# Patient Record
Sex: Male | Born: 1995 | Race: Black or African American | Hispanic: No | Marital: Single | Smoking: Never smoker
Health system: Southern US, Community
[De-identification: ages and names within clinical notes are randomized; demographics above are authoritative.]

## PROBLEM LIST (undated history)

## (undated) ENCOUNTER — Ambulatory Visit (HOSPITAL_COMMUNITY): Disposition: A | Discharge status: Other Acute Inpatient Hospital | Payer: BLUE CROSS/BLUE SHIELD

## (undated) DIAGNOSIS — F419 Anxiety disorder, unspecified: Secondary | ICD-10-CM

## (undated) DIAGNOSIS — G43909 Migraine, unspecified, not intractable, without status migrainosus: Secondary | ICD-10-CM

## (undated) HISTORY — PX: WISDOM TOOTH EXTRACTION: SHX21

---

## 2013-09-19 ENCOUNTER — Emergency Department (HOSPITAL_COMMUNITY)
Admission: EM | Admit: 2013-09-19 | Discharge: 2013-09-19 | Disposition: A | Discharge status: Home / Self Care | Payer: BC Managed Care – PPO | Attending: Emergency Medicine | Admitting: Emergency Medicine

## 2013-09-19 ENCOUNTER — Encounter (HOSPITAL_COMMUNITY): Payer: Self-pay | Admitting: Emergency Medicine

## 2013-09-19 ENCOUNTER — Emergency Department (HOSPITAL_COMMUNITY): Payer: BC Managed Care – PPO

## 2013-09-19 DIAGNOSIS — F411 Generalized anxiety disorder: Secondary | ICD-10-CM | POA: Diagnosis not present

## 2013-09-19 DIAGNOSIS — R002 Palpitations: Secondary | ICD-10-CM | POA: Diagnosis not present

## 2013-09-19 DIAGNOSIS — R0789 Other chest pain: Secondary | ICD-10-CM | POA: Diagnosis present

## 2013-09-19 DIAGNOSIS — R079 Chest pain, unspecified: Secondary | ICD-10-CM

## 2013-09-19 HISTORY — DX: Anxiety disorder, unspecified: F41.9

## 2013-09-19 LAB — CBC
HCT: 43.2 % (ref 39.0–52.0)
Hemoglobin: 15.2 g/dL (ref 13.0–17.0)
MCH: 30.6 pg (ref 26.0–34.0)
MCHC: 35.2 g/dL (ref 30.0–36.0)
MCV: 87.1 fL (ref 78.0–100.0)
PLATELETS: 240 10*3/uL (ref 150–400)
RBC: 4.96 MIL/uL (ref 4.22–5.81)
RDW: 11.9 % (ref 11.5–15.5)
WBC: 6.8 10*3/uL (ref 4.0–10.5)

## 2013-09-19 LAB — I-STAT TROPONIN, ED: Troponin i, poc: 0 ng/mL (ref 0.00–0.08)

## 2013-09-19 LAB — BASIC METABOLIC PANEL
BUN: 11 mg/dL (ref 6–23)
CO2: 27 meq/L (ref 19–32)
Calcium: 10 mg/dL (ref 8.4–10.5)
Chloride: 101 mEq/L (ref 96–112)
Creatinine, Ser: 1.16 mg/dL (ref 0.50–1.35)
GFR calc Af Amer: >90 mL/min (ref >90)
GLUCOSE: 86 mg/dL (ref 70–99)
POTASSIUM: 3.8 meq/L (ref 3.7–5.3)
Sodium: 141 mEq/L (ref 137–147)

## 2013-09-19 NOTE — ED Notes (Signed)
Hes had sharp CP and racing heart intermittent since last year when he was diagnosed with anxiety. States the pain "just comes and goes randomly." He is A&Ox4, resp e/u

## 2013-09-19 NOTE — ED Provider Notes (Signed)
CSN: [id number]     Arrival date & time 09/19/13  1725 History   First MD Initiated Contact with Patient 09/19/13 2028     Chief Complaint  Patient presents with  . Chest Pain    (Consider location/radiation/quality/duration/timing/severity/associated sxs/prior Treatment) HPI Comments: Patient is a 18 year old male with a history of anxiety who presents to the emergency department for chest pain and palpitations. Patient states he has been having these symptoms for multiple months. He states that symptoms "just come on randomly" but seem to occur almost every day. Patient states that symptoms are alleviated with sleeping or spontaneously, with time. Patient denies any aggravating factors of his symptoms. He states he was evaluated for symptoms in the past and told that they were due to anxiety. He denies associated fever, syncope, jaw pain, shortness of breath, hemoptysis or cough, numbness/tingling, extremity weakness, and vomiting. Patient denies history of heart disease or family history of ACS. He states he is otherwise healthy with no medical problems.  Patient is a 18 y.o. male presenting with chest pain. The history is provided by the patient. No language interpreter was used.  Chest Pain Associated symptoms: palpitations   Associated symptoms: no cough, no fever, no numbness, no shortness of breath, not vomiting and no weakness     Past Medical History  Diagnosis Date  . Anxiety    History reviewed. No pertinent past surgical history. History reviewed. No pertinent family history. History  Substance Use Topics  . Smoking status: Never Smoker   . Smokeless tobacco: Not on file  . Alcohol Use: No    Review of Systems  Constitutional: Negative for fever.  Respiratory: Negative for cough and shortness of breath.   Cardiovascular: Positive for chest pain and palpitations.  Gastrointestinal: Negative for vomiting.  Neurological: Negative for syncope, weakness and numbness.  All  other systems reviewed and are negative.    Allergies  Shrimp  Home Medications   Prior to Admission medications   Medication Sig Start Date End Date Taking? Authorizing Provider  ibuprofen (ADVIL,MOTRIN) 600 MG tablet Take 600 mg by mouth every 6 (six) hours as needed for mild pain.   Yes Historical Provider, MD   BP 151/66  Pulse 86  Temp(Src) 98 F (36.7 C)  Resp 14  Ht 5\' 6"  (1.676 m)  Wt 200 lb (90.719 kg)  BMI 32.30 kg/m2  SpO2 100%  Physical Exam  Nursing note and vitals reviewed. Constitutional: He is oriented to person, place, and time. He appears well-developed and well-nourished. No distress.  Well and nontoxic/nonseptic appearing  HENT:  Head: Normocephalic and atraumatic.  Mouth/Throat: Oropharynx is clear and moist. No oropharyngeal exudate.  Eyes: Conjunctivae and EOM are normal. Pupils are equal, round, and reactive to light. No scleral icterus.  Neck: Normal range of motion.  Cardiovascular: Normal rate, regular rhythm and normal heart sounds.   Heart regular rate and rhythm. No JVD. No murmurs appreciated.  Pulmonary/Chest: Effort normal and breath sounds normal. No respiratory distress. He has no wheezes. He has no rales.  Respirations even and unlabored. Chest expansion symmetric. No tachypnea or dyspnea noted.  Musculoskeletal: Normal range of motion.  Neurological: He is alert and oriented to person, place, and time.  GCS 15. Patient moves extremities without ataxia.  Skin: Skin is warm and dry. No rash noted. He is not diaphoretic. No erythema. No pallor.  Psychiatric: He has a normal mood and affect. His behavior is normal.    ED Course  Procedures (including  critical care time) Labs Review Labs Reviewed  CBC  BASIC METABOLIC PANEL  Rosezena SensorI-STAT TROPOININ, ED   Imaging Review Dg Chest 2 View  09/19/2013   CLINICAL DATA:  Chest pain  EXAM: CHEST - 2 VIEW  COMPARISON:  None.  FINDINGS: Normal heart size. Clear lungs. No pleural effusion. No  pneumothorax.  IMPRESSION: No active cardiopulmonary disease.   Electronically Signed   By: Maryclare BeanArt  Hoss M.D.   On: 09/19/2013 18:45    EKG Interpretation   Date/Time:  Thursday September 19 2013 17:29:05 EDT Ventricular Rate:  93 PR Interval:  140 QRS Duration: 86 QT Interval:  338 QTC Calculation: 420 R Axis:   66 Text Interpretation:  Normal sinus rhythm Normal ECG No old tracing to  compare Confirmed by GOLDSTON  MD, SCOTT ([id number]) on 09/19/2013 9:17:55 PM      MDM   Final diagnoses:  Palpitations  Chest pain    18 year old male with no significant past medical history presents today for chest pain and palpitations. Patient has no chest pain at present. He states that he has been having symptoms intermittently over the last few months. Patient today has a negative cardiac workup. His heart score is 0 c/w with low risk of MACE; clinically, I have very low suspicion for ACS in this patient. Also have very low suspicion for PE, especially with lack of tachypnea, dyspnea, or hypoxia. Well's PE score 0 to 1.5 c/w low risk of PE. EKG reviewed which shows no evidence of PR prolongation. Patient denies symptoms to suggest decreased cardiac output.  Do not believe further emergent workup is indicated at this time. Suspect symptoms may be related to anxiety. Still, will refer patient to cardiology for further evaluation of symptoms. Patient may require further evaluation with Holter monitor. Return precautions discussed and patient agreeable to plan with no unaddressed concerns.   Filed Vitals:   09/19/13 1729 09/19/13 2024 09/19/13 2030 09/19/13 2125  BP: 143/77 151/66 143/76 152/66  Pulse: 100 86 102 91  Temp: 98 F (36.7 C)     Resp: 20 14 20 14   Height: 5\' 6"  (1.676 m)     Weight: 200 lb (90.719 kg)     SpO2: 100% 100% 100% 100%      Antony MaduraKelly Gyselle Matthew, PA-C 09/19/13 2205

## 2013-09-19 NOTE — Discharge Instructions (Signed)
Your heart enzymes, chest x-ray, and EKG of your heart rhythm today are all normal. Recommend followup with a cardiologist to further discuss your symptoms. You may be required to wear a Holter monitor for further evaluation of your palpitations and chest pain. Return to the emergency department if symptoms worsen.  Palpitations  A palpitation is the feeling that your heartbeat is irregular or is faster than normal. It may feel like your heart is fluttering or skipping a beat. Palpitations are usually not a serious problem. However, in some cases, you may need further medical evaluation. CAUSES  Palpitations can be caused by:  Smoking.  Caffeine or other stimulants, such as diet pills or energy drinks.  Alcohol.  Stress and anxiety.  Strenuous physical activity.  Fatigue.  Certain medicines.  Heart disease, especially if you have a history of arrhythmias. This includes atrial fibrillation, atrial flutter, or supraventricular tachycardia.  An improperly working pacemaker or defibrillator. DIAGNOSIS  To find the cause of your palpitations, your caregiver will take your history and perform a physical exam. Tests may also be done, including:  Electrocardiography (ECG). This test records the heart's electrical activity.  Cardiac monitoring. This allows your caregiver to monitor your heart rate and rhythm in real time.  Holter monitor. This is a portable device that records your heartbeat and can help diagnose heart arrhythmias. It allows your caregiver to track your heart activity for several days, if needed.  Stress tests by exercise or by giving medicine that makes the heart beat faster. TREATMENT  Treatment of palpitations depends on the cause of your symptoms and can vary greatly. Most cases of palpitations do not require any treatment other than time, relaxation, and monitoring your symptoms. Other causes, such as atrial fibrillation, atrial flutter, or supraventricular  tachycardia, usually require further treatment. HOME CARE INSTRUCTIONS   Avoid:  Caffeinated coffee, tea, soft drinks, diet pills, and energy drinks.  Chocolate.  Alcohol.  Stop smoking if you smoke.  Reduce your stress and anxiety. Things that can help you relax include:  A method that measures bodily functions so you can learn to control them (biofeedback).  Yoga.  Meditation.  Physical activity such as swimming, jogging, or walking.  Get plenty of rest and sleep. SEEK MEDICAL CARE IF:   You continue to have a fast or irregular heartbeat beyond 24 hours.  Your palpitations occur more often. SEEK IMMEDIATE MEDICAL CARE IF:  You develop chest pain or shortness of breath.  You have a severe headache.  You feel dizzy, or you faint. MAKE SURE YOU:  Understand these instructions.  Will watch your condition.  Will get help right away if you are not doing well or get worse. Document Released: 05/20/2000 Document Revised: 09/17/2012 Document Reviewed: 07/22/2011 [facility] Patient Information 2014 [place], [place].

## 2013-09-20 NOTE — ED Provider Notes (Signed)
Medical screening examination/treatment/procedure(s) were performed by non-physician practitioner and as supervising physician I was immediately available for consultation/collaboration.   EKG Interpretation   Date/Time:  Thursday September 19 2013 17:29:05 EDT Ventricular Rate:  93 PR Interval:  140 QRS Duration: 86 QT Interval:  338 QTC Calculation: 420 R Axis:   66 Text Interpretation:  Normal sinus rhythm Normal ECG No old tracing to  compare Confirmed by Oscar Hank  MD, Jumanah Hynson ([id number]) on 09/19/2013 9:17:55 PM        Audree CamelScott T Terez Freimark, MD 09/20/13 0002

## 2013-10-18 ENCOUNTER — Ambulatory Visit: Payer: BC Managed Care – PPO | Admitting: Cardiovascular Disease

## 2014-02-28 ENCOUNTER — Encounter (HOSPITAL_COMMUNITY): Payer: Self-pay | Admitting: Emergency Medicine

## 2014-02-28 ENCOUNTER — Emergency Department (INDEPENDENT_AMBULATORY_CARE_PROVIDER_SITE_OTHER)
Admission: EM | Admit: 2014-02-28 | Discharge: 2014-02-28 | Disposition: A | Discharge status: Home / Self Care | Payer: BC Managed Care – PPO | Source: Home / Self Care | Attending: Family Medicine | Admitting: Family Medicine

## 2014-02-28 DIAGNOSIS — J302 Other seasonal allergic rhinitis: Secondary | ICD-10-CM

## 2014-02-28 DIAGNOSIS — J309 Allergic rhinitis, unspecified: Secondary | ICD-10-CM

## 2014-02-28 MED ORDER — IPRATROPIUM BROMIDE 0.06 % NA SOLN: 2.0000 | Freq: Four times a day (QID) | NASAL | Status: DC

## 2014-02-28 MED ORDER — CETIRIZINE HCL 10 MG PO TABS: 10.0000 mg | ORAL_TABLET | Freq: Every day | ORAL | Status: DC

## 2014-02-28 NOTE — ED Notes (Signed)
Patient c/o cold sx including runny nose, chest congestion, cough and sinus pressure x 3 days. Patient reports he has taken Nyquil with no relief. Patient is alert and oriented and in NAD.

## 2014-02-28 NOTE — ED Provider Notes (Signed)
CSN: [id number]     Arrival date & time 02/28/14  1315 History   First MD Initiated Contact with Patient 02/28/14 1338     Chief Complaint  Patient presents with  . URI   (Consider location/radiation/quality/duration/timing/severity/associated sxs/prior Treatment) Patient is a 18 y.o. male presenting with URI. The history is provided by the patient.  URI Presenting symptoms: congestion, cough and rhinorrhea   Presenting symptoms: no fever   Severity:  Mild Onset quality:  Gradual Duration:  3 days Chronicity:  New Relieved by:  None tried Worsened by:  Nothing tried Associated symptoms: no sneezing, no swollen glands and no wheezing   Risk factors: no sick contacts     Past Medical History  Diagnosis Date  . Anxiety    History reviewed. No pertinent past surgical history. No family history on file. History  Substance Use Topics  . Smoking status: Never Smoker   . Smokeless tobacco: Not on file  . Alcohol Use: No    Review of Systems  Constitutional: Negative.  Negative for fever.  HENT: Positive for congestion, postnasal drip and rhinorrhea. Negative for sneezing.   Respiratory: Positive for cough. Negative for wheezing.   Cardiovascular: Negative.     Allergies  Shrimp  Home Medications   Prior to Admission medications   Medication Sig Start Date End Date Taking? Authorizing Provider  cetirizine (ZYRTEC) 10 MG tablet Take 1 tablet (10 mg total) by mouth daily. 02/28/14   Linna Hoff, MD  ibuprofen (ADVIL,MOTRIN) 600 MG tablet Take 600 mg by mouth every 6 (six) hours as needed for mild pain.    Historical Provider, MD  ipratropium (ATROVENT) 0.06 % nasal spray Place 2 sprays into both nostrils 4 (four) times daily. 02/28/14   Linna Hoff, MD   BP 132/78  Pulse 86  Temp(Src) 98.2 F (36.8 C) (Oral)  Resp 16  SpO2 98% Physical Exam  Nursing note and vitals reviewed. Constitutional: He is oriented to person, place, and time. He appears well-developed and  well-nourished. No distress.  HENT:  Right Ear: External ear normal.  Left Ear: External ear normal.  Nose: Mucosal edema and rhinorrhea present.  Mouth/Throat: Oropharynx is clear and moist.  Eyes: Pupils are equal, round, and reactive to light.  Neck: Normal range of motion. Neck supple.  Cardiovascular: Normal heart sounds.   Pulmonary/Chest: Breath sounds normal.  Lymphadenopathy:    He has no cervical adenopathy.  Neurological: He is alert and oriented to person, place, and time.  Skin: Skin is warm and dry.    ED Course  Procedures (including critical care time) Labs Review Labs Reviewed - No data to display  Imaging Review No results found.   MDM   1. Seasonal allergic rhinitis        Linna Hoff, MD 02/28/14 1355

## 2014-03-25 ENCOUNTER — Emergency Department (INDEPENDENT_AMBULATORY_CARE_PROVIDER_SITE_OTHER)
Admission: EM | Admit: 2014-03-25 | Discharge: 2014-03-25 | Disposition: A | Discharge status: Home / Self Care | Payer: BC Managed Care – PPO | Source: Home / Self Care

## 2014-03-25 ENCOUNTER — Encounter (HOSPITAL_COMMUNITY): Payer: Self-pay | Admitting: Emergency Medicine

## 2014-03-25 DIAGNOSIS — R059 Cough, unspecified: Secondary | ICD-10-CM

## 2014-03-25 DIAGNOSIS — R05 Cough: Secondary | ICD-10-CM

## 2014-03-25 MED ORDER — GUAIFENESIN-CODEINE 100-10 MG/5ML PO SYRP: 5.0000 mL | ORAL_SOLUTION | Freq: Four times a day (QID) | ORAL | Status: DC | PRN

## 2014-03-25 MED ORDER — ALBUTEROL SULFATE HFA 108 (90 BASE) MCG/ACT IN AERS: INHALATION_SPRAY | RESPIRATORY_TRACT | Status: DC

## 2014-03-25 NOTE — Discharge Instructions (Signed)
Cough, Adult Albuterol for hidden bronchospasm Cheratussin for cough as needed Pepcid or zantac trial for possible reflux Claritin for drainage  A cough is a reflex. It helps you clear your throat and airways. A cough can help heal your body. A cough can last 2 or 3 weeks (acute) or may last more than 8 weeks (chronic). Some common causes of a cough can include an infection, allergy, or a cold. HOME CARE  Only take medicine as told by your doctor.  If given, take your medicines (antibiotics) as told. Finish them even if you start to feel better.  Use a cold steam vaporizer or humidifier in your home. This can help loosen thick spit (secretions).  Sleep so you are almost sitting up (semi-upright). Use pillows to do this. This helps reduce coughing.  Rest as needed.  Stop smoking if you smoke. GET HELP RIGHT AWAY IF:  You have yellowish-white fluid (pus) in your thick spit.  Your cough gets worse.  Your medicine does not reduce coughing, and you are losing sleep.  You cough up blood.  You have trouble breathing.  Your pain gets worse and medicine does not help.  You have a fever. MAKE SURE YOU:   Understand these instructions.  Will watch your condition.  Will get help right away if you are not doing well or get worse. Document Released: 02/03/2011 Document Revised: 10/07/2013 Document Reviewed: 02/03/2011 [facility]kinExitCare Patient Information 2015 [place]Exit[place], [place]LLC. This information is not intended to replace advice given to you by your health care provider. Make sure you discuss any questions you have with your health care provider.

## 2014-03-25 NOTE — ED Notes (Signed)
C/o cough x 3 wks with mild sob.  States with walking feels sob. Denies fever and any other symptoms.

## 2014-03-25 NOTE — ED Provider Notes (Signed)
CSN: [id number]     Arrival date & time 03/25/14  1348 History   First MD Initiated Contact with Patient 03/25/14 1410     Chief Complaint  Patient presents with  . Cough   (Consider location/radiation/quality/duration/timing/severity/associated sxs/prior Treatment) HPI Comments: Cough for 3 weeks. Seen here at that time, not improved. No meds currently.   Past Medical History  Diagnosis Date  . Anxiety    History reviewed. No pertinent past surgical history. History reviewed. No pertinent family history. History  Substance Use Topics  . Smoking status: Never Smoker   . Smokeless tobacco: Not on file  . Alcohol Use: No    Review of Systems  Constitutional: Negative for fever, activity change and fatigue.  HENT: Negative.   Respiratory: Positive for cough.        Occasional exertional dyspnea  Cardiovascular: Negative.  Negative for chest pain.  Gastrointestinal: Negative.   Skin: Negative.   Neurological: Negative.     Allergies  Shrimp  Home Medications   Prior to Admission medications   Medication Sig Start Date End Date Taking? Authorizing Provider  albuterol (PROVENTIL HFA;VENTOLIN HFA) 108 (90 BASE) MCG/ACT inhaler Two inhalations every 4 hours prn cough and wheeze 03/25/14   Hayden Rasmussenavid Andric Kerce, NP  cetirizine (ZYRTEC) 10 MG tablet Take 1 tablet (10 mg total) by mouth daily. 02/28/14   Linna HoffJames D Kindl, MD  guaiFENesin-codeine (CHERATUSSIN AC) 100-10 MG/5ML syrup Take 5 mLs by mouth 4 (four) times daily as needed for cough or congestion. 03/25/14   Hayden Rasmussenavid Quest Tavenner, NP  ibuprofen (ADVIL,MOTRIN) 600 MG tablet Take 600 mg by mouth every 6 (six) hours as needed for mild pain.    Historical Provider, MD  ipratropium (ATROVENT) 0.06 % nasal spray Place 2 sprays into both nostrils 4 (four) times daily. 02/28/14   Linna HoffJames D Kindl, MD   BP 113/67  Pulse 78  Temp(Src) 98.3 F (36.8 C) (Oral)  Resp 12  SpO2 100% Physical Exam  Nursing note and vitals reviewed. Constitutional: He is  oriented to person, place, and time. He appears well-developed and well-nourished. No distress.  Neck: Normal range of motion. Neck supple.  Cardiovascular: Normal rate and regular rhythm.   Pulmonary/Chest: Effort normal and breath sounds normal. No respiratory distress. He has no wheezes. He has no rales.  Musculoskeletal: Normal range of motion. He exhibits no edema.  Lymphadenopathy:    He has no cervical adenopathy.  Neurological: He is alert and oriented to person, place, and time.  Skin: Skin is warm and dry. No rash noted.  Psychiatric: He has a normal mood and affect.    ED Course  Procedures (including critical care time) Labs Review Labs Reviewed - No data to display  Imaging Review No results found.   MDM   1. Cough    Albuterol for hidden bronchospasm Cheratussin for cough as needed Pepcid or zantac trial for possible reflux Claritin for drainage     Hayden Rasmussenavid Haevyn Ury, NP 03/25/14 1445

## 2014-03-28 NOTE — ED Provider Notes (Signed)
Medical screening examination/treatment/procedure(s) were performed by resident physician or non-physician practitioner and as supervising physician I was immediately available for consultation/collaboration.   Evia Goldsmith DOUGLAS MD.   Savalas Monje D Harini Dearmond, MD 03/28/14 1007 

## 2014-04-22 ENCOUNTER — Emergency Department (HOSPITAL_COMMUNITY)
Admission: EM | Admit: 2014-04-22 | Discharge: 2014-04-22 | Discharge status: Absent without leave | Payer: BC Managed Care – PPO | Source: Home / Self Care

## 2014-12-26 ENCOUNTER — Emergency Department (HOSPITAL_COMMUNITY)
Admission: EM | Admit: 2014-12-26 | Discharge: 2014-12-26 | Disposition: A | Discharge status: Home / Self Care | Payer: Medicaid Other | Attending: Emergency Medicine | Admitting: Emergency Medicine

## 2014-12-26 ENCOUNTER — Encounter (HOSPITAL_COMMUNITY): Payer: Self-pay | Admitting: Vascular Surgery

## 2014-12-26 DIAGNOSIS — R51 Headache: Secondary | ICD-10-CM | POA: Insufficient documentation

## 2014-12-26 DIAGNOSIS — R05 Cough: Secondary | ICD-10-CM | POA: Insufficient documentation

## 2014-12-26 DIAGNOSIS — Z8659 Personal history of other mental and behavioral disorders: Secondary | ICD-10-CM | POA: Insufficient documentation

## 2014-12-26 DIAGNOSIS — R0981 Nasal congestion: Secondary | ICD-10-CM | POA: Insufficient documentation

## 2014-12-26 DIAGNOSIS — R059 Cough, unspecified: Secondary | ICD-10-CM

## 2014-12-26 DIAGNOSIS — Z79899 Other long term (current) drug therapy: Secondary | ICD-10-CM | POA: Diagnosis not present

## 2014-12-26 MED ORDER — AZITHROMYCIN 250 MG PO TABS: 250.0000 mg | ORAL_TABLET | Freq: Every day | ORAL | Status: DC

## 2014-12-26 NOTE — ED Notes (Signed)
Pt reports to the ED for eval of nasal congestion, sinus pressure, and productive yellow cough x 1 week. Denies any fevers, chills, N/V/D, or SOB. Pt A&Ox4, resp e/u, and skin warm and dry.

## 2014-12-26 NOTE — Discharge Instructions (Signed)
Take the prescribed medication as directed. May continue using over the counter cough medicine as needed. Return to the ED for new or worsening symptoms.

## 2014-12-26 NOTE — ED Provider Notes (Signed)
CSN: [id number]     Arrival date & time 12/26/14  0915 History  This chart was scribed for non-physician practitioner, Garlon Hatchet, PA-C working with Doug Sou, MD by Placido Sou, ED scribe. This patient was seen in room TR08C/TR08C and the patient's care was started at 9:43 AM.   Chief Complaint  Patient presents with  . Cough   The history is provided by the patient. No language interpreter was used.    HPI Comments: Jordan Andrews is a 19 y.o. male who presents to the Emergency Department complaining of an intermittent, moderate, productive cough with yellow sputum with onset 1 week ago. Pt notes associated congestion, sinus pressure, postnasal drip and a mild, intermittent, HA. He notes taking Theraflu, Robitussin and Nyquil with very little relief of his symptoms. He denies any hx of asthma. Pt denies fever or chills.   Past Medical History  Diagnosis Date  . Anxiety    No past surgical history on file. No family history on file. History  Substance Use Topics  . Smoking status: Never Smoker   . Smokeless tobacco: Not on file  . Alcohol Use: No    Review of Systems  Constitutional: Negative for fever and chills.  HENT: Positive for congestion, postnasal drip, rhinorrhea and sinus pressure.   Respiratory: Positive for cough.   Neurological: Positive for headaches.  All other systems reviewed and are negative.  Allergies  Shrimp  Home Medications   Prior to Admission medications   Medication Sig Start Date End Date Taking? Authorizing Provider  albuterol (PROVENTIL HFA;VENTOLIN HFA) 108 (90 BASE) MCG/ACT inhaler Two inhalations every 4 hours prn cough and wheeze 03/25/14   Hayden Rasmussen, NP  cetirizine (ZYRTEC) 10 MG tablet Take 1 tablet (10 mg total) by mouth daily. 02/28/14   Linna Hoff, MD  guaiFENesin-codeine (CHERATUSSIN AC) 100-10 MG/5ML syrup Take 5 mLs by mouth 4 (four) times daily as needed for cough or congestion. 03/25/14   Hayden Rasmussen, NP  ibuprofen  (ADVIL,MOTRIN) 600 MG tablet Take 600 mg by mouth every 6 (six) hours as needed for mild pain.    Historical Provider, MD  ipratropium (ATROVENT) 0.06 % nasal spray Place 2 sprays into both nostrils 4 (four) times daily. 02/28/14   Linna Hoff, MD   BP 125/74 mmHg  Pulse 119  Temp(Src) 97.4 F (36.3 C) (Oral)  Resp 16  SpO2 98%   Physical Exam  Constitutional: He is oriented to person, place, and time. He appears well-developed and well-nourished. No distress.  HENT:  Head: Normocephalic and atraumatic.  Right Ear: Tympanic membrane and ear canal normal.  Left Ear: Tympanic membrane and ear canal normal.  Nose: Nose normal.  Mouth/Throat: Uvula is midline, oropharynx is clear and moist and mucous membranes are normal. No oropharyngeal exudate, posterior oropharyngeal edema or posterior oropharyngeal erythema.  Eyes: Conjunctivae and EOM are normal. Pupils are equal, round, and reactive to light.  Neck: Normal range of motion. Neck supple.  Cardiovascular: Normal rate, regular rhythm and normal heart sounds.   Pulmonary/Chest: Effort normal. No respiratory distress. He has no wheezes. He has rhonchi.  Respirations unlabored, mild rhonchi noted in left lung fields; O2 sats stable on room air  Abdominal: Soft. Bowel sounds are normal.  Musculoskeletal: Normal range of motion.  Neurological: He is alert and oriented to person, place, and time.  Skin: Skin is warm and dry. He is not diaphoretic.  Psychiatric: He has a normal mood and affect.  Nursing note and  vitals reviewed.   ED Course  Procedures  DIAGNOSTIC STUDIES: Oxygen Saturation is 98% on RA, normal by my interpretation.    COORDINATION OF CARE: 9:45 AM Discussed treatment plan with pt at bedside and pt agreed to plan.  Labs Review Labs Reviewed - No data to display  Imaging Review No results found.   EKG Interpretation None      MDM   Final diagnoses:  Cough  Nasal congestion   18 y.o. M here with URI  type symptoms over the past week.  Here patient is afebrile, non-toxic in appearance.  He does have slight rhonchi noted in his left lower lung fields. Exam is otherwise unremarkable. I discussed options of obtaining chest x-ray and/or antibiotic therapy, patient opted for medications only which I feel is appropriate. Will discharge home with azithromycin for coverage of CAP.  Patient to FU with PCP.  Discussed plan with patient, he/she acknowledged understanding and agreed with plan of care.  Return precautions given for new or worsening symptoms.  I personally performed the services described in this documentation, which was scribed in my presence. The recorded information has been reviewed and is accurate.  Garlon Hatchet, PA-C 12/26/14 1014  Doug Sou, MD 12/26/14 [phone]

## 2015-01-02 ENCOUNTER — Encounter (HOSPITAL_COMMUNITY): Payer: Self-pay | Admitting: Emergency Medicine

## 2015-01-02 ENCOUNTER — Emergency Department (HOSPITAL_COMMUNITY)
Admission: EM | Admit: 2015-01-02 | Discharge: 2015-01-02 | Disposition: A | Discharge status: Home / Self Care | Payer: Medicaid Other | Attending: Emergency Medicine | Admitting: Emergency Medicine

## 2015-01-02 ENCOUNTER — Emergency Department (HOSPITAL_COMMUNITY): Payer: Medicaid Other

## 2015-01-02 DIAGNOSIS — J209 Acute bronchitis, unspecified: Secondary | ICD-10-CM | POA: Insufficient documentation

## 2015-01-02 DIAGNOSIS — Z79899 Other long term (current) drug therapy: Secondary | ICD-10-CM | POA: Insufficient documentation

## 2015-01-02 DIAGNOSIS — Z8659 Personal history of other mental and behavioral disorders: Secondary | ICD-10-CM | POA: Insufficient documentation

## 2015-01-02 DIAGNOSIS — R05 Cough: Secondary | ICD-10-CM | POA: Diagnosis present

## 2015-01-02 DIAGNOSIS — J40 Bronchitis, not specified as acute or chronic: Secondary | ICD-10-CM

## 2015-01-02 DIAGNOSIS — Z8701 Personal history of pneumonia (recurrent): Secondary | ICD-10-CM | POA: Insufficient documentation

## 2015-01-02 MED ORDER — PREDNISONE 50 MG PO TABS: 50.0000 mg | ORAL_TABLET | Freq: Every day | ORAL | Status: DC

## 2015-01-02 MED ORDER — PREDNISONE 20 MG PO TABS
60.0000 mg | ORAL_TABLET | Freq: Once | ORAL | Status: AC
  Administered 2015-01-02: 60 mg via ORAL
  Filled 2015-01-02: qty 3

## 2015-01-02 NOTE — ED Notes (Signed)
Pt. Stated, I was here last week for pneumonia, now I have a cough with just a lot of mucous.

## 2015-01-02 NOTE — ED Provider Notes (Signed)
CSN: [id number]     Arrival date & time 01/02/15  1143 History   First MD Initiated Contact with Patient 01/02/15 1243     Chief Complaint  Patient presents with  . Cough  . Nasal Congestion     (Consider location/radiation/quality/duration/timing/severity/associated sxs/prior Treatment) HPI Tremane Spurgeon is a 19 y.o. male with history of anxiety, presents to emergency room complaining of cough. Patient states he was seen a week ago for the same, states he was diagnosed with pneumonia. He did not have a chest x-ray at that time. He was prescribed azithromycin which she finished. Patient states his cough improved significantly, however he is still coughing. States cough is productive with white sputum. He denies any fever or chills. Denies any chest pain. No shortness of breath. He is not a smoker. Denies any sore throat, nasal congestion. No other complaints. Currently not taking any medications for the cough.=  Past Medical History  Diagnosis Date  . Anxiety    History reviewed. No pertinent past surgical history. No family history on file. History  Substance Use Topics  . Smoking status: Never Smoker   . Smokeless tobacco: Not on file  . Alcohol Use: No    Review of Systems  Constitutional: Negative for fever and chills.  HENT: Negative for congestion.   Respiratory: Positive for cough. Negative for chest tightness and shortness of breath.   Cardiovascular: Negative for chest pain, palpitations and leg swelling.  Genitourinary: Negative for dysuria, urgency, frequency and hematuria.  Musculoskeletal: Negative for myalgias, arthralgias, neck pain and neck stiffness.  Skin: Negative for rash.  Allergic/Immunologic: Negative for immunocompromised state.  Neurological: Negative for dizziness, weakness, light-headedness, numbness and headaches.  All other systems reviewed and are negative.     Allergies  Shrimp  Home Medications   Prior to Admission medications   Medication  Sig Start Date End Date Taking? Authorizing Provider  albuterol (PROVENTIL HFA;VENTOLIN HFA) 108 (90 BASE) MCG/ACT inhaler Two inhalations every 4 hours prn cough and wheeze 03/25/14   Hayden Rasmussen, NP  azithromycin (ZITHROMAX) 250 MG tablet Take 1 tablet (250 mg total) by mouth daily. Take first 2 tablets together, then 1 every day until finished. 12/26/14   Garlon Hatchet, PA-C  cetirizine (ZYRTEC) 10 MG tablet Take 1 tablet (10 mg total) by mouth daily. 02/28/14   Linna Hoff, MD  guaiFENesin-codeine (CHERATUSSIN AC) 100-10 MG/5ML syrup Take 5 mLs by mouth 4 (four) times daily as needed for cough or congestion. 03/25/14   Hayden Rasmussen, NP  ibuprofen (ADVIL,MOTRIN) 600 MG tablet Take 600 mg by mouth every 6 (six) hours as needed for mild pain.    Historical Provider, MD  ipratropium (ATROVENT) 0.06 % nasal spray Place 2 sprays into both nostrils 4 (four) times daily. 02/28/14   Linna Hoff, MD   BP 132/76 mmHg  Pulse 100  Temp(Src) 98.5 F (36.9 C) (Oral)  Resp 18  Ht 5\' 8"  (1.727 m)  Wt 208 lb (94.348 kg)  BMI 31.63 kg/m2  SpO2 99% Physical Exam  Constitutional: He appears well-developed and well-nourished. No distress.  HENT:  Head: Normocephalic and atraumatic.  Eyes: Conjunctivae are normal.  Neck: Neck supple.  Cardiovascular: Normal rate, regular rhythm and normal heart sounds.   Pulmonary/Chest: Effort normal. No respiratory distress. He has no wheezes. He has no rales.  Musculoskeletal: He exhibits no edema.  Neurological: He is alert.  Skin: Skin is warm and dry.  Nursing note and vitals reviewed.   ED  Course  Procedures (including critical care time) Labs Review Labs Reviewed - No data to display  Imaging Review Dg Chest 2 View  01/02/2015   CLINICAL DATA:  19 year old male with persistent cough. Recent diagnosis of pneumonia.  EXAM: CHEST  2 VIEW  COMPARISON:  Prior chest x-ray 09/19/2013  FINDINGS: The lungs are clear and negative for focal airspace consolidation,  pulmonary edema or suspicious pulmonary nodule. No pleural effusion or pneumothorax. Cardiac and mediastinal contours are within normal limits. No acute fracture or lytic or blastic osseous lesions. The visualized upper abdominal bowel gas pattern is unremarkable.  IMPRESSION: Normal chest x-ray.   Electronically Signed   By: Malachy Moan M.D.   On: 01/02/2015 13:11     EKG Interpretation None      MDM   Final diagnoses:  Bronchitis    Patient in emergency department with cough, just finished Z-Pak, cough for over a week now. Will get chest x-ray to rule out pneumonia. Patient's vital signs are normal.  1:33 PM X-rays negative. Patient is not hypoxic, not tachycardic, does not appear to be septic. His lungs are clear on exam. Most likely bronchitis, will start on 5 day prednisone. Follow up with PCP.   Filed Vitals:   01/02/15 1214  BP: 132/76  Pulse: 100  Temp: 98.5 F (36.9 C)  TempSrc: Oral  Resp: 18  Height:  (1.727 m)  Weight: 208 lb (94.348 kg)  SpO2: 99%      Jaynie Crumble, PA-C 01/02/15 1434  Rolland Porter, MD 01/03/15 [phone]

## 2015-01-02 NOTE — Discharge Instructions (Signed)
Continue robitussin for cough. Take prednisone as prescribed until all gone. Follow up with primary care doctor if not improving.   Your chest xray is normal today.   Acute Bronchitis Bronchitis is inflammation of the airways that extend from the windpipe into the lungs (bronchi). The inflammation often causes mucus to develop. This leads to a cough, which is the most common symptom of bronchitis.  In acute bronchitis, the condition usually develops suddenly and goes away over time, usually in a couple weeks. Smoking, allergies, and asthma can make bronchitis worse. Repeated episodes of bronchitis may cause further lung problems.  CAUSES Acute bronchitis is most often caused by the same virus that causes a cold. The virus can spread from person to person (contagious) through coughing, sneezing, and touching contaminated objects. SIGNS AND SYMPTOMS   Cough.   Fever.   Coughing up mucus.   Body aches.   Chest congestion.   Chills.   Shortness of breath.   Sore throat.  DIAGNOSIS  Acute bronchitis is usually diagnosed through a physical exam. Your health care provider will also ask you questions about your medical history. Tests, such as chest X-rays, are sometimes done to rule out other conditions.  TREATMENT  Acute bronchitis usually goes away in a couple weeks. Oftentimes, no medical treatment is necessary. Medicines are sometimes given for relief of fever or cough. Antibiotic medicines are usually not needed but may be prescribed in certain situations. In some cases, an inhaler may be recommended to help reduce shortness of breath and control the cough. A cool mist vaporizer may also be used to help thin bronchial secretions and make it easier to clear the chest.  HOME CARE INSTRUCTIONS  Get plenty of rest.   Drink enough fluids to keep your urine clear or pale yellow (unless you have a medical condition that requires fluid restriction). Increasing fluids may help thin  your respiratory secretions (sputum) and reduce chest congestion, and it will prevent dehydration.   Take medicines only as directed by your health care provider.  If you were prescribed an antibiotic medicine, finish it all even if you start to feel better.  Avoid smoking and secondhand smoke. Exposure to cigarette smoke or irritating chemicals will make bronchitis worse. If you are a smoker, consider using nicotine gum or skin patches to help control withdrawal symptoms. Quitting smoking will help your lungs heal faster.   Reduce the chances of another bout of acute bronchitis by washing your hands frequently, avoiding people with cold symptoms, and trying not to touch your hands to your mouth, nose, or eyes.   Keep all follow-up visits as directed by your health care provider.  SEEK MEDICAL CARE IF: Your symptoms do not improve after 1 week of treatment.  SEEK IMMEDIATE MEDICAL CARE IF:  You develop an increased fever or chills.   You have chest pain.   You have severe shortness of breath.  You have bloody sputum.   You develop dehydration.  You faint or repeatedly feel like you are going to pass out.  You develop repeated vomiting.  You develop a severe headache. MAKE SURE YOU:   Understand these instructions.  Will watch your condition.  Will get help right away if you are not doing well or get worse. Document Released: 06/30/2004 Document Revised: 10/07/2013 Document Reviewed: 11/13/2012 [facility] Patient Information 2015 [place], [place]. This information is not intended to replace advice given to you by your health care provider. Make sure you discuss any questions you  have with your health care provider. ° °

## 2015-01-22 ENCOUNTER — Encounter (HOSPITAL_COMMUNITY): Payer: Self-pay | Admitting: *Deleted

## 2015-01-22 ENCOUNTER — Emergency Department (INDEPENDENT_AMBULATORY_CARE_PROVIDER_SITE_OTHER)
Admission: EM | Admit: 2015-01-22 | Discharge: 2015-01-22 | Disposition: A | Discharge status: Home / Self Care | Payer: Medicaid Other | Source: Home / Self Care | Attending: Family Medicine | Admitting: Family Medicine

## 2015-01-22 DIAGNOSIS — R51 Headache: Secondary | ICD-10-CM

## 2015-01-22 DIAGNOSIS — R519 Headache, unspecified: Secondary | ICD-10-CM

## 2015-01-22 MED ORDER — TRAZODONE HCL 50 MG PO TABS: 50.0000 mg | ORAL_TABLET | Freq: Every evening | ORAL | Status: DC | PRN

## 2015-01-22 NOTE — ED Provider Notes (Signed)
CSN: [id number]     Arrival date & time 01/22/15  1301 History   First MD Initiated Contact with Patient 01/22/15 1317     Chief Complaint  Patient presents with  . Headache   (Consider location/radiation/quality/duration/timing/severity/associated sxs/prior Treatment) Patient is a 19 y.o. male presenting with headaches. The history is provided by the patient.  Headache Pain location:  Frontal Quality:  Dull Radiates to:  Does not radiate Onset quality:  Gradual Duration:  3 weeks Progression:  Unchanged Chronicity:  New Similar to prior headaches: no   Context: not exposure to bright light, not caffeine, not coughing and not stress   Associated symptoms: no dizziness and no numbness     Past Medical History  Diagnosis Date  . Anxiety    History reviewed. No pertinent past surgical history. History reviewed. No pertinent family history. Social History  Substance Use Topics  . Smoking status: Never Smoker   . Smokeless tobacco: None  . Alcohol Use: No    Review of Systems  Constitutional: Negative.   Eyes: Negative.   Neurological: Positive for headaches. Negative for dizziness, light-headedness and numbness.    Allergies  Shrimp  Home Medications   Prior to Admission medications   Medication Sig Start Date End Date Taking? Authorizing Provider  albuterol (PROVENTIL HFA;VENTOLIN HFA) 108 (90 BASE) MCG/ACT inhaler Two inhalations every 4 hours prn cough and wheeze 03/25/14   Hayden Rasmussen, NP  azithromycin (ZITHROMAX) 250 MG tablet Take 1 tablet (250 mg total) by mouth daily. Take first 2 tablets together, then 1 every day until finished. 12/26/14   Garlon Hatchet, PA-C  cetirizine (ZYRTEC) 10 MG tablet Take 1 tablet (10 mg total) by mouth daily. 02/28/14   Linna Hoff, MD  guaiFENesin-codeine (CHERATUSSIN AC) 100-10 MG/5ML syrup Take 5 mLs by mouth 4 (four) times daily as needed for cough or congestion. 03/25/14   Hayden Rasmussen, NP  ibuprofen (ADVIL,MOTRIN) 600 MG tablet  Take 600 mg by mouth every 6 (six) hours as needed for mild pain.    Historical Provider, MD  ipratropium (ATROVENT) 0.06 % nasal spray Place 2 sprays into both nostrils 4 (four) times daily. 02/28/14   Linna Hoff, MD  predniSONE (DELTASONE) 50 MG tablet Take 1 tablet (50 mg total) by mouth daily. 01/02/15   [name], PA-C  traZODone (DESYREL) 50 MG tablet Take 1 tablet (50 mg total) by mouth at bedtime as needed for sleep. 01/22/15   Linna Hoff, MD   BP 130/78 mmHg  Pulse 74  Temp(Src) 98.6 F (37 C) (Oral)  Resp 18  SpO2 100% Physical Exam  Constitutional: He is oriented to person, place, and time. He appears well-developed and well-nourished. No distress.  HENT:  Head: Normocephalic.  Right Ear: External ear normal.  Left Ear: External ear normal.  Mouth/Throat: Oropharynx is clear and moist.  Eyes: Conjunctivae and EOM are normal. Pupils are equal, round, and reactive to light.  Neck: Normal range of motion. Neck supple.  Cardiovascular: Normal heart sounds.   Pulmonary/Chest: Breath sounds normal.  Lymphadenopathy:    He has no cervical adenopathy.  Neurological: He is alert and oriented to person, place, and time. No cranial nerve deficit. Coordination normal.  Skin: Skin is warm and dry.  Nursing note and vitals reviewed.   ED Course  Procedures (including critical care time) Labs Review Labs Reviewed - No data to display  Imaging Review No results found.   MDM   1. Headache in front  of head        Linna Hoff, MD 01/22/15 1356

## 2015-01-22 NOTE — ED Notes (Signed)
Pt  Reports   Symptoms      Of      Headache         X  sev  Weeks        describes  The     Sensation  As  A  Pressure      denys  Any  Nausea  /  Vomiting        Sitting  Upright on the  Exam table  Speaking in  Complete   sentances

## 2015-04-13 ENCOUNTER — Encounter (HOSPITAL_COMMUNITY): Payer: Self-pay | Admitting: *Deleted

## 2015-04-13 ENCOUNTER — Emergency Department (HOSPITAL_COMMUNITY)
Admission: EM | Admit: 2015-04-13 | Discharge: 2015-04-13 | Disposition: A | Discharge status: Home / Self Care | Payer: Medicaid Other | Attending: Emergency Medicine | Admitting: Emergency Medicine

## 2015-04-13 DIAGNOSIS — Z79899 Other long term (current) drug therapy: Secondary | ICD-10-CM | POA: Insufficient documentation

## 2015-04-13 DIAGNOSIS — F419 Anxiety disorder, unspecified: Secondary | ICD-10-CM | POA: Insufficient documentation

## 2015-04-13 DIAGNOSIS — H109 Unspecified conjunctivitis: Secondary | ICD-10-CM

## 2015-04-13 MED ORDER — OFLOXACIN 0.3 % OP SOLN
2.0000 [drp] | Freq: Four times a day (QID) | OPHTHALMIC | Status: DC
  Administered 2015-04-13: 2 [drp] via OPHTHALMIC
  Filled 2015-04-13: qty 5

## 2015-04-13 NOTE — Discharge Instructions (Signed)
Bacterial Conjunctivitis Follow up with opthalmology.  Bacterial conjunctivitis, commonly called pink eye, is an inflammation of the clear membrane that covers the white part of the eye (conjunctiva). The inflammation can also happen on the underside of the eyelids. The blood vessels in the conjunctiva become inflamed, causing the eye to become red or pink. Bacterial conjunctivitis may spread easily from one eye to another and from person to person (contagious).  CAUSES  Bacterial conjunctivitis is caused by bacteria. The bacteria may come from your own skin, your upper respiratory tract, or from someone else with bacterial conjunctivitis. SYMPTOMS  The normally white color of the eye or the underside of the eyelid is usually pink or red. The pink eye is usually associated with irritation, tearing, and some sensitivity to light. Bacterial conjunctivitis is often associated with a thick, yellowish discharge from the eye. The discharge may turn into a crust on the eyelids overnight, which causes your eyelids to stick together. If a discharge is present, there may also be some blurred vision in the affected eye. DIAGNOSIS  Bacterial conjunctivitis is diagnosed by your caregiver through an eye exam and the symptoms that you report. Your caregiver looks for changes in the surface tissues of your eyes, which may point to the specific type of conjunctivitis. A sample of any discharge may be collected on a cotton-tip swab if you have a severe case of conjunctivitis, if your cornea is affected, or if you keep getting repeat infections that do not respond to treatment. The sample will be sent to a lab to see if the inflammation is caused by a bacterial infection and to see if the infection will respond to antibiotic medicines. TREATMENT   Bacterial conjunctivitis is treated with antibiotics. Antibiotic eyedrops are most often used. However, antibiotic ointments are also available. Antibiotics pills are sometimes  used. Artificial tears or eye washes may ease discomfort. HOME CARE INSTRUCTIONS   To ease discomfort, apply a cool, clean washcloth to your eye for 10-20 minutes, 3-4 times a day.  Gently wipe away any drainage from your eye with a warm, wet washcloth or a cotton ball.  Wash your hands often with soap and water. Use paper towels to dry your hands.  Do not share towels or washcloths. This may spread the infection.  Change or wash your pillowcase every day.  You should not use eye makeup until the infection is gone.  Do not operate machinery or drive if your vision is blurred.  Stop using contact lenses. Ask your caregiver how to sterilize or replace your contacts before using them again. This depends on the type of contact lenses that you use.  When applying medicine to the infected eye, do not touch the edge of your eyelid with the eyedrop bottle or ointment tube. SEEK IMMEDIATE MEDICAL CARE IF:   Your infection has not improved within 3 days after beginning treatment.  You had yellow discharge from your eye and it returns.  You have increased eye pain.  Your eye redness is spreading.  Your vision becomes blurred.  You have a fever or persistent symptoms for more than 2-3 days.  You have a fever and your symptoms suddenly get worse.  You have facial pain, redness, or swelling. MAKE SURE YOU:   Understand these instructions.  Will watch your condition.  Will get help right away if you are not doing well or get worse.   This information is not intended to replace advice given to you by your health  care provider. Make sure you discuss any questions you have with your health care provider.   Document Released: 05/23/2005 Document Revised: 06/13/2014 Document Reviewed: 10/24/2011 Elsevier Interactive Patient Education [employer].

## 2015-04-13 NOTE — ED Provider Notes (Signed)
CSN: [id number]     Arrival date & time 04/13/15  1015 History  By signing my name below, I, Jordan Andrews, attest that this documentation has been prepared under the direction and in the presence of Catha GosselinHanna Patel-Mills, PA-C Electronically Signed: Charline BillsEssence Andrews, ED Scribe 04/13/2015 at 11:22 AM.   Chief Complaint  Patient presents with  . Conjunctivitis   The history is provided by the patient. No language interpreter was used.   HPI Comments: Jordan Andrews is a 19 y.o. male who presents to the Emergency Department complaining of sudden onset of left eye redness since yesterday morning. Pt reports associated left eye pain that he describes as a burning sensation, left eye drainage and crusting upon waking this morning. He denies fever or visual disturbances. Pt wears corrective lenses and states that he is wearing them now. No sick contacts.  He denies any fever, chills.  Past Medical History  Diagnosis Date  . Anxiety    History reviewed. No pertinent past surgical history. History reviewed. No pertinent family history. Social History  Substance Use Topics  . Smoking status: Never Smoker   . Smokeless tobacco: None  . Alcohol Use: No    Review of Systems  Constitutional: Negative for fever.  Eyes: Positive for discharge, redness and itching. Negative for photophobia and visual disturbance.   Allergies  Shrimp  Home Medications   Prior to Admission medications   Medication Sig Start Date End Date Taking? Authorizing Provider  albuterol (PROVENTIL HFA;VENTOLIN HFA) 108 (90 BASE) MCG/ACT inhaler Two inhalations every 4 hours prn cough and wheeze 03/25/14   [name], NP  azithromycin (ZITHROMAX) 250 MG tablet Take 1 tablet (250 mg total) by mouth daily. Take first 2 tablets together, then 1 every day until finished. 12/26/14   Garlon HatchetLisa M Sanders, PA-C  cetirizine (ZYRTEC) 10 MG tablet Take 1 tablet (10 mg total) by mouth daily. 02/28/14   Linna HoffJames D Kindl, MD  guaiFENesin-codeine  (CHERATUSSIN AC) 100-10 MG/5ML syrup Take 5 mLs by mouth 4 (four) times daily as needed for cough or congestion. 03/25/14   [name], NP  ibuprofen (ADVIL,MOTRIN) 600 MG tablet Take 600 mg by mouth every 6 (six) hours as needed for mild pain.    Historical Provider, MD  ipratropium (ATROVENT) 0.06 % nasal spray Place 2 sprays into both nostrils 4 (four) times daily. 02/28/14   Linna HoffJames D Kindl, MD  predniSONE (DELTASONE) 50 MG tablet Take 1 tablet (50 mg total) by mouth daily. 01/02/15   [name], PA-C  traZODone (DESYREL) 50 MG tablet Take 1 tablet (50 mg total) by mouth at bedtime as needed for sleep. 01/22/15   Linna HoffJames D Kindl, MD   BP 123/66 mmHg  Pulse 93  Temp(Src) 98 F (36.7 C) (Oral)  Resp 16  SpO2 100% Physical Exam  Constitutional: He is oriented to person, place, and time. He appears well-developed and well-nourished. No distress.  HENT:  Head: Normocephalic and atraumatic.  Eyes: EOM are normal. Pupils are equal, round, and reactive to light. No foreign body present in the left eye.  L conjunctiva is erythematous. EOMS intact. PERRL. No foreign bodies seen. No iritis.  Neck: Neck supple. No tracheal deviation present.  Cardiovascular: Normal rate.   Pulmonary/Chest: Effort normal. No respiratory distress.  Musculoskeletal: Normal range of motion.  Neurological: He is alert and oriented to person, place, and time.  Skin: Skin is warm and dry.  Psychiatric: He has a normal mood and affect. His behavior is normal.  Nursing note and vitals reviewed.  ED Course  Procedures (including critical care time) DIAGNOSTIC STUDIES: Oxygen Saturation is 100% on RA, normal by my interpretation.    COORDINATION OF CARE: 11:18 AM-Discussed treatment plan with pt at bedside and pt agreed to plan.   Labs Review Labs Reviewed - No data to display  Imaging Review No results found.   EKG Interpretation None       Visual Acuity  Right Eye Distance: 20/50 Left Eye  Distance: 20/50 Bilateral Distance: 20/40  Right Eye Near:   Left Eye Near:    Bilateral Near:     MDM   Final diagnoses:  Conjunctivitis of left eye  Patient presents for left eye redness that began yesterday morning. I do not suspect corneal abrasion or foreign body. He denies foreign body sensation. This looks like a bacterial conjunctivitis. 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 Patient was given ophthalmology referral. I discussed return precautions with the patient and he verbally agrees with the plan. Medications  ofloxacin (OCUFLOX) 0.3 % ophthalmic solution 2 drop (2 drops Left Eye Given 04/13/15 1208)   Filed Vitals:   04/13/15 1212  BP: 123/72  Pulse: 78  Temp: 97.8 F (36.6 C)  Resp: 16   I personally performed the services described in this documentation, which was scribed in my presence. The recorded information has been reviewed and is accurate.    [name], PA-C 04/13/15 1448  Laurence Spates, MD 04/13/15 [phone]

## 2015-04-13 NOTE — ED Notes (Signed)
Declined W/C at D/C and was escorted to lobby by RN. 

## 2015-04-13 NOTE — ED Notes (Signed)
Pt reports left eye redness and itching since yesterday morning.

## 2015-06-12 ENCOUNTER — Emergency Department (HOSPITAL_COMMUNITY)
Admission: EM | Admit: 2015-06-12 | Discharge: 2015-06-13 | Disposition: A | Discharge status: Home / Self Care | Payer: BLUE CROSS/BLUE SHIELD | Attending: Emergency Medicine | Admitting: Emergency Medicine

## 2015-06-12 ENCOUNTER — Encounter (HOSPITAL_COMMUNITY): Payer: Self-pay | Admitting: Emergency Medicine

## 2015-06-12 DIAGNOSIS — R51 Headache: Secondary | ICD-10-CM | POA: Diagnosis present

## 2015-06-12 DIAGNOSIS — F419 Anxiety disorder, unspecified: Secondary | ICD-10-CM | POA: Diagnosis not present

## 2015-06-12 DIAGNOSIS — R519 Headache, unspecified: Secondary | ICD-10-CM

## 2015-06-12 DIAGNOSIS — Z7951 Long term (current) use of inhaled steroids: Secondary | ICD-10-CM | POA: Insufficient documentation

## 2015-06-12 DIAGNOSIS — Z79899 Other long term (current) drug therapy: Secondary | ICD-10-CM | POA: Insufficient documentation

## 2015-06-12 DIAGNOSIS — Z8679 Personal history of other diseases of the circulatory system: Secondary | ICD-10-CM | POA: Insufficient documentation

## 2015-06-12 HISTORY — DX: Migraine, unspecified, not intractable, without status migrainosus: G43.909

## 2015-06-12 MED ORDER — DIPHENHYDRAMINE HCL 25 MG PO CAPS
50.0000 mg | ORAL_CAPSULE | Freq: Once | ORAL | Status: AC
  Administered 2015-06-13: 50 mg via ORAL
  Filled 2015-06-12: qty 2

## 2015-06-12 MED ORDER — METOCLOPRAMIDE HCL 10 MG PO TABS: 10.0000 mg | ORAL_TABLET | Freq: Three times a day (TID) | ORAL | Status: DC | PRN

## 2015-06-12 MED ORDER — KETOROLAC TROMETHAMINE 60 MG/2ML IM SOLN
60.0000 mg | Freq: Once | INTRAMUSCULAR | Status: AC
  Administered 2015-06-13: 60 mg via INTRAMUSCULAR
  Filled 2015-06-12: qty 2

## 2015-06-12 MED ORDER — METOCLOPRAMIDE HCL 10 MG PO TABS
10.0000 mg | ORAL_TABLET | Freq: Once | ORAL | Status: AC
  Administered 2015-06-13: 10 mg via ORAL
  Filled 2015-06-12: qty 1

## 2015-06-12 NOTE — ED Provider Notes (Signed)
CSN: [id number]     Arrival date & time 06/12/15  2233 History   By signing my name below, I, Arlan Organ, attest that this documentation has been prepared under the direction and in the presence of Tomasita Crumble, MD.  Electronically Signed: Arlan Organ, ED Scribe. 06/12/2015. 11:26 PM.   Chief Complaint  Patient presents with  . Headache   The history is provided by the patient. No language interpreter was used.    HPI Comments: Jordan Andrews is a 20 y.o. male with a PMHx of Migraine who presents to the Emergency Department complaining of constant, ongoing, recurrent HA x 2-3 weeks. No aggravating or alleviating factors. He denies any associated symptoms at this time. Pt was seen by his PCP earlier this week and was started on an Augmentin. However, pt denies any improvement. No additional OTC medications or home remedies attempted prior to arrival. No recent fever, chills, cough, sore throat, nausea, vomiting, chest pain, or shortness of breath.  PCP: PROVIDER NOT IN SYSTEM    Past Medical History  Diagnosis Date  . Anxiety   . Migraine    Past Surgical History  Procedure Laterality Date  . Wisdom tooth extraction     History reviewed. No pertinent family history. Social History  Substance Use Topics  . Smoking status: Never Smoker   . Smokeless tobacco: None  . Alcohol Use: No    Review of Systems  Constitutional: Negative for fever and chills.  Respiratory: Negative for shortness of breath.   Cardiovascular: Negative for chest pain.  Gastrointestinal: Negative for nausea, vomiting, abdominal pain and diarrhea.  Neurological: Positive for headaches.  Psychiatric/Behavioral: Negative for confusion.  All other systems reviewed and are negative.     Allergies  Shrimp  Home Medications   Prior to Admission medications   Medication Sig Start Date End Date Taking? Authorizing Provider  albuterol (PROVENTIL HFA;VENTOLIN HFA) 108 (90 BASE) MCG/ACT inhaler Two inhalations  every 4 hours prn cough and wheeze 03/25/14   Hayden Rasmussen, NP  azithromycin (ZITHROMAX) 250 MG tablet Take 1 tablet (250 mg total) by mouth daily. Take first 2 tablets together, then 1 every day until finished. 12/26/14   Garlon Hatchet, PA-C  cetirizine (ZYRTEC) 10 MG tablet Take 1 tablet (10 mg total) by mouth daily. 02/28/14   Linna Hoff, MD  guaiFENesin-codeine (CHERATUSSIN AC) 100-10 MG/5ML syrup Take 5 mLs by mouth 4 (four) times daily as needed for cough or congestion. 03/25/14   Hayden Rasmussen, NP  ibuprofen (ADVIL,MOTRIN) 600 MG tablet Take 600 mg by mouth every 6 (six) hours as needed for mild pain.    Historical Provider, MD  ipratropium (ATROVENT) 0.06 % nasal spray Place 2 sprays into both nostrils 4 (four) times daily. 02/28/14   Linna Hoff, MD  predniSONE (DELTASONE) 50 MG tablet Take 1 tablet (50 mg total) by mouth daily. 01/02/15   [name], PA-C  traZODone (DESYREL) 50 MG tablet Take 1 tablet (50 mg total) by mouth at bedtime as needed for sleep. 01/22/15   Linna Hoff, MD   BP 143/80 mmHg  Pulse 94  Temp(Src) 98.2 F (36.8 C) (Oral)  Resp 16  Wt 225 lb 4.8 oz (102.195 kg)  SpO2 93%   Physical Exam  Constitutional: He is oriented to person, place, and time. Vital signs are normal. He appears well-developed and well-nourished.  Non-toxic appearance. He does not appear ill. No distress.  HENT:  Head: Normocephalic and atraumatic.  Nose: Nose normal.  Mouth/Throat: Oropharynx is clear and moist. No oropharyngeal exudate.  Eyes: Conjunctivae and EOM are normal. Pupils are equal, round, and reactive to light. No scleral icterus.  Neck: Normal range of motion. Neck supple. No tracheal deviation, no edema, no erythema and normal range of motion present. No thyroid mass and no thyromegaly present.  Cardiovascular: Normal rate, regular rhythm, S1 normal, S2 normal, normal heart sounds, intact distal pulses and normal pulses.  Exam reveals no gallop and no friction rub.   No  murmur heard. Pulmonary/Chest: Effort normal and breath sounds normal. No respiratory distress. He has no wheezes. He has no rhonchi. He has no rales.  Abdominal: Soft. Normal appearance and bowel sounds are normal. He exhibits no distension, no ascites and no mass. There is no hepatosplenomegaly. There is no tenderness. There is no rebound, no guarding and no CVA tenderness.  Musculoskeletal: Normal range of motion. He exhibits no edema or tenderness.  Lymphadenopathy:    He has no cervical adenopathy.  Neurological: He is alert and oriented to person, place, and time. He has normal strength. No cranial nerve deficit or sensory deficit.  Normal strength and sensation in all extremities Normal cerebellar testing  Skin: Skin is warm, dry and intact. No petechiae and no rash noted. He is not diaphoretic. No erythema. No pallor.  Psychiatric: He has a normal mood and affect. His behavior is normal. Judgment normal.  Nursing note and vitals reviewed.   ED Course  Procedures (including critical care time)  DIAGNOSTIC STUDIES: Oxygen Saturation is 93% on RA, adequate by my interpretation.    COORDINATION OF CARE: 11:17 PM- Will give Toradol, Reglan, and Benadryl. Discussed treatment plan with pt at bedside and pt agreed to plan.     Labs Review Labs Reviewed - No data to display  Imaging Review No results found. I have personally reviewed and evaluated these images and lab results as part of my medical decision-making.   EKG Interpretation None      MDM   Final diagnoses:  None   Patient presents emergency department for headache. He states is consistent with his normal migraines. He denies any significant URI symptoms to me. He is advised to stop Augmentin for treatment. He was given Toradol, Reglan, Benadryl for his headache in the emergency department. We'll discharge with 10 tablets of Reglan to use at home as needed. Neurological exam is normal, he appears well in no acute  distress. Vital signs remain within his normal limits and he is safe for discharge.    I personally performed the services described in this documentation, which was scribed in my presence. The recorded information has been reviewed and is accurate.     Tomasita CrumbleAdeleke Hasana Alcorta, MD 06/12/15 2333

## 2015-06-12 NOTE — Discharge Instructions (Signed)
General Headache Without Cause Mr. Jordan Andrews, take ibuprofen as needed for your headaches.  If the pain becomes severe, take reglan.  See a primary care doctor within 3 days for close follow up. You do not need to take antibiotics anymore.  If symptoms worsen, come back to the ED immediately. Thank you. A headache is pain or discomfort felt around the head or neck area. There are many causes and types of headaches. In some cases, the cause may not be found.  HOME CARE  Managing Pain  Take over-the-counter and prescription medicines only as told by your doctor.  Lie down in a dark, quiet room when you have a headache.  If directed, apply ice to the head and neck area:  Put ice in a plastic bag.  Place a towel between your skin and the bag.  Leave the ice on for 20 minutes, 2-3 times per day.  Use a heating pad or hot shower to apply heat to the head and neck area as told by your doctor.  Keep lights dim if bright lights bother you or make your headaches worse. Eating and Drinking  Eat meals on a regular schedule.  Lessen how much alcohol you drink.  Lessen how much caffeine you drink, or stop drinking caffeine. General Instructions  Keep all follow-up visits as told by your doctor. This is important.  Keep a journal to find out if certain things bring on headaches. For example, write down:  What you eat and drink.  How much sleep you get.  Any change to your diet or medicines.  Relax by getting a massage or doing other relaxing activities.  Lessen stress.  Sit up straight. Do not tighten (tense) your muscles.  Do not use tobacco products. This includes cigarettes, chewing tobacco, or e-cigarettes. If you need help quitting, ask your doctor.  Exercise regularly as told by your doctor.  Get enough sleep. This often means 7-9 hours of sleep. GET HELP IF:  Your symptoms are not helped by medicine.  You have a headache that feels different than the other headaches.  You  feel sick to your stomach (nauseous) or you throw up (vomit).  You have a fever. GET HELP RIGHT AWAY IF:   Your headache becomes really bad.  You keep throwing up.  You have a stiff neck.  You have trouble seeing.  You have trouble speaking.  You have pain in the eye or ear.  Your muscles are weak or you lose muscle control.  You lose your balance or have trouble walking.  You feel like you will pass out (faint) or you pass out.  You have confusion.   This information is not intended to replace advice given to you by your health care provider. Make sure you discuss any questions you have with your health care provider.   Document Released: 03/01/2008 Document Revised: 02/11/2015 Document Reviewed: 09/15/2014 Elsevier Interactive Patient Education [employer]2016 Elsevier [employer].

## 2015-06-12 NOTE — ED Notes (Signed)
Patient here with complaint of frontal headache. Was seen by MD earlier this week and prescribed Abx and has been taking since Tues without improvement. Patient currently states pain is worse than prior to starting abx. Denies cough, sore throat, post nasal drip, and fever.

## 2015-08-25 ENCOUNTER — Emergency Department (HOSPITAL_COMMUNITY)
Admission: EM | Admit: 2015-08-25 | Discharge: 2015-08-25 | Disposition: A | Discharge status: Home / Self Care | Payer: BLUE CROSS/BLUE SHIELD | Attending: Emergency Medicine | Admitting: Emergency Medicine

## 2015-08-25 ENCOUNTER — Encounter (HOSPITAL_COMMUNITY): Payer: Self-pay | Admitting: *Deleted

## 2015-08-25 DIAGNOSIS — Z792 Long term (current) use of antibiotics: Secondary | ICD-10-CM | POA: Insufficient documentation

## 2015-08-25 DIAGNOSIS — Y9389 Activity, other specified: Secondary | ICD-10-CM | POA: Diagnosis not present

## 2015-08-25 DIAGNOSIS — S0003XA Contusion of scalp, initial encounter: Secondary | ICD-10-CM | POA: Insufficient documentation

## 2015-08-25 DIAGNOSIS — Y9289 Other specified places as the place of occurrence of the external cause: Secondary | ICD-10-CM | POA: Insufficient documentation

## 2015-08-25 DIAGNOSIS — W208XXA Other cause of strike by thrown, projected or falling object, initial encounter: Secondary | ICD-10-CM | POA: Diagnosis not present

## 2015-08-25 DIAGNOSIS — Z79899 Other long term (current) drug therapy: Secondary | ICD-10-CM | POA: Diagnosis not present

## 2015-08-25 DIAGNOSIS — Z8669 Personal history of other diseases of the nervous system and sense organs: Secondary | ICD-10-CM | POA: Diagnosis not present

## 2015-08-25 DIAGNOSIS — Z8659 Personal history of other mental and behavioral disorders: Secondary | ICD-10-CM | POA: Insufficient documentation

## 2015-08-25 DIAGNOSIS — Y990 Civilian activity done for income or pay: Secondary | ICD-10-CM | POA: Diagnosis not present

## 2015-08-25 DIAGNOSIS — S0990XA Unspecified injury of head, initial encounter: Secondary | ICD-10-CM | POA: Diagnosis present

## 2015-08-25 NOTE — ED Notes (Signed)
See EDP assessment 

## 2015-08-25 NOTE — ED Notes (Signed)
Called with no awnser.

## 2015-08-25 NOTE — ED Provider Notes (Signed)
CSN: [id number]     Arrival date & time 08/25/15  1550 History  By signing my name below, I, Jordan Andrews, attest that this documentation has been prepared under the direction and in the presence of Jordan Morn NP. Electronically Signed: Bethel Andrews, ED Scribe. 08/25/2015 7:06 PM Chief Complaint  Patient presents with  . Head Injury   Patient is a 20 y.o. male presenting with head injury. The history is provided by the patient. No language interpreter was used.  Head Injury Location:  Frontal Mechanism of injury: work   Chronicity:  New Relieved by:  Nothing Worsened by:  Nothing tried Ineffective treatments:  None tried Associated symptoms: headache   Associated symptoms: no blurred vision, no disorientation, no double vision, no loss of consciousness, no numbness and no vomiting   Risk factors: occupational exposure    Strother Everitt is a 20 y.o. male who presents to the Emergency Department for evaluation after being struck on the head by an automatic door today  at work. Pt states that he was walking under the door when it lowered and struck him. Associated symptoms include mild frontal headache. Pt denies LOC, blurred vision, focal weakness, and vomiting.   Past Medical History  Diagnosis Date  . Anxiety   . Migraine    Past Surgical History  Procedure Laterality Date  . Wisdom tooth extraction     No family history on file. Social History  Substance Use Topics  . Smoking status: Never Smoker   . Smokeless tobacco: None  . Alcohol Use: No    Review of Systems  Eyes: Negative for blurred vision and double vision.  Gastrointestinal: Negative for vomiting.  Skin: Negative for wound.  Neurological: Positive for headaches. Negative for loss of consciousness and numbness.  All other systems reviewed and are negative.   Allergies  Shrimp  Home Medications   Prior to Admission medications   Medication Sig Start Date End Date Taking? Authorizing Provider  albuterol  (PROVENTIL HFA;VENTOLIN HFA) 108 (90 BASE) MCG/ACT inhaler Two inhalations every 4 hours prn cough and wheeze 03/25/14   Hayden Rasmussen, NP  amoxicillin-clavulanate (AUGMENTIN) 875-125 MG tablet Take 1 tablet by mouth 2 (two) times daily.    Historical Provider, MD  ibuprofen (ADVIL,MOTRIN) 800 MG tablet Take 800 mg by mouth every 8 (eight) hours as needed for headache.    Historical Provider, MD  metoCLOPramide (REGLAN) 10 MG tablet Take 1 tablet (10 mg total) by mouth every 8 (eight) hours as needed (headache). 06/12/15   Tomasita Crumble, MD   BP 121/62 mmHg  Pulse 86  Temp(Src) 98.3 F (36.8 C) (Oral)  Resp 18  SpO2 98% Physical Exam  Constitutional: He is oriented to person, place, and time. He appears well-developed and well-nourished. No distress.  HENT:  Head: Normocephalic and atraumatic.  Frontal/parietal headache with no bruising or swelling  Eyes: Conjunctivae and EOM are normal.  Neck: Neck supple. No tracheal deviation present.  Cardiovascular: Normal rate.   Pulmonary/Chest: Effort normal. No respiratory distress.  Musculoskeletal: Normal range of motion.  Neurological: He is alert and oriented to person, place, and time. No cranial nerve deficit. He exhibits normal muscle tone. Coordination normal.  Skin: Skin is warm and dry.  Psychiatric: He has a normal mood and affect. His behavior is normal.  Nursing note and vitals reviewed.   ED Course  Procedures (including critical care time) DIAGNOSTIC STUDIES: Oxygen Saturation is 98% on RA,  normal by my interpretation.    COORDINATION  OF CARE: 7:02 PM Discussed treatment plan with pt at bedside and pt agreed to plan.  Labs Review Labs Reviewed - No data to display  Imaging Review No results found.   EKG Interpretation None      MDM   Final diagnoses:  None  Scalp contusion. No loss of consciousness. Normal neuro exam. No indication for imaging at present. Care instructions provided and return precautions discussed.   Workman's comp drug screen obtained.  [facility]    I personally performed the services described in this documentation, which was scribed in my presence. The recorded information has been reviewed and is accurate.    Jordan Mornavid Kareem Aul, NP 08/25/15 2250  Lyndal Pulleyaniel Knott, MD 08/26/15 [phone]

## 2015-08-25 NOTE — ED Notes (Signed)
At job he pulled string to release door at his work and the door came down and hit him on the head and then went back up.  No loc. Pt feels headache, no nausea.

## 2015-08-25 NOTE — ED Notes (Signed)
Patient found sleeping in waiting room. 

## 2015-08-25 NOTE — Discharge Instructions (Signed)

## 2015-08-25 NOTE — ED Notes (Signed)
Patient called with no answer. °

## 2015-08-28 ENCOUNTER — Encounter (HOSPITAL_COMMUNITY): Payer: Self-pay | Admitting: Emergency Medicine

## 2015-08-28 ENCOUNTER — Emergency Department (HOSPITAL_COMMUNITY)
Admission: EM | Admit: 2015-08-28 | Discharge: 2015-08-28 | Disposition: A | Discharge status: Home / Self Care | Payer: Worker's Compensation | Attending: Emergency Medicine | Admitting: Emergency Medicine

## 2015-08-28 DIAGNOSIS — W208XXD Other cause of strike by thrown, projected or falling object, subsequent encounter: Secondary | ICD-10-CM | POA: Diagnosis not present

## 2015-08-28 DIAGNOSIS — Z8659 Personal history of other mental and behavioral disorders: Secondary | ICD-10-CM | POA: Insufficient documentation

## 2015-08-28 DIAGNOSIS — R51 Headache: Secondary | ICD-10-CM | POA: Diagnosis present

## 2015-08-28 DIAGNOSIS — Z8679 Personal history of other diseases of the circulatory system: Secondary | ICD-10-CM | POA: Diagnosis not present

## 2015-08-28 DIAGNOSIS — S060X0D Concussion without loss of consciousness, subsequent encounter: Secondary | ICD-10-CM

## 2015-08-28 DIAGNOSIS — Z792 Long term (current) use of antibiotics: Secondary | ICD-10-CM | POA: Insufficient documentation

## 2015-08-28 DIAGNOSIS — Z79899 Other long term (current) drug therapy: Secondary | ICD-10-CM | POA: Insufficient documentation

## 2015-08-28 NOTE — Discharge Instructions (Signed)
Concussion, Adult  A concussion, or closed-head injury, is a brain injury caused by a direct blow to the head or by a quick and sudden movement (jolt) of the head or neck. Concussions are usually not life-threatening. Even so, the effects of a concussion can be serious. If you have had a concussion before, you are more likely to experience concussion-like symptoms after a direct blow to the head.   CAUSES  · Direct blow to the head, such as from running into another player during a soccer game, being hit in a fight, or hitting your head on a hard surface.  · A jolt of the head or neck that causes the brain to move back and forth inside the skull, such as in a car crash.  SIGNS AND SYMPTOMS  The signs of a concussion can be hard to notice. Early on, they may be missed by you, family members, and health care providers. You may look fine but act or feel differently.  Symptoms are usually temporary, but they may last for days, weeks, or even longer. Some symptoms may appear right away while others may not show up for hours or days. Every head injury is different. Symptoms include:  · Mild to moderate headaches that will not go away.  · A feeling of pressure inside your head.  · Having more trouble than usual:    Learning or remembering things you have heard.    Answering questions.    Paying attention or concentrating.    Organizing daily tasks.    Making decisions and solving problems.  · Slowness in thinking, acting or reacting, speaking, or reading.  · Getting lost or being easily confused.  · Feeling tired all the time or lacking energy (fatigued).  · Feeling drowsy.  · Sleep disturbances.    Sleeping more than usual.    Sleeping less than usual.    Trouble falling asleep.    Trouble sleeping (insomnia).  · Loss of balance or feeling lightheaded or dizzy.  · Nausea or vomiting.  · Numbness or tingling.  · Increased sensitivity to:    Sounds.    Lights.    Distractions.  · Vision problems or eyes that tire  easily.  · Diminished sense of taste or smell.  · Ringing in the ears.  · Mood changes such as feeling sad or anxious.  · Becoming easily irritated or angry for little or no reason.  · Lack of motivation.  · Seeing or hearing things other people do not see or hear (hallucinations).  DIAGNOSIS  Your health care provider can usually diagnose a concussion based on a description of your injury and symptoms. He or she will ask whether you passed out (lost consciousness) and whether you are having trouble remembering events that happened right before and during your injury.  Your evaluation might include:  · A brain scan to look for signs of injury to the brain. Even if the test shows no injury, you may still have a concussion.  · Blood tests to be sure other problems are not present.  TREATMENT  · Concussions are usually treated in an emergency department, in urgent care, or at a clinic. You may need to stay in the hospital overnight for further treatment.  · Tell your health care provider if you are taking any medicines, including prescription medicines, over-the-counter medicines, and natural remedies. Some medicines, such as blood thinners (anticoagulants) and aspirin, may increase the chance of complications. Also tell your health care   provider whether you have had alcohol or are taking illegal drugs. This information may affect treatment.  · Your health care provider will send you home with important instructions to follow.  · How fast you will recover from a concussion depends on many factors. These factors include how severe your concussion is, what part of your brain was injured, your age, and how healthy you were before the concussion.  · Most people with mild injuries recover fully. Recovery can take time. In general, recovery is slower in older persons. Also, persons who have had a concussion in the past or have other medical problems may find that it takes longer to recover from their current injury.  HOME  CARE INSTRUCTIONS  General Instructions  · Carefully follow the directions your health care provider gave you.  · Only take over-the-counter or prescription medicines for pain, discomfort, or fever as directed by your health care provider.  · Take only those medicines that your health care provider has approved.  · Do not drink alcohol until your health care provider says you are well enough to do so. Alcohol and certain other drugs may slow your recovery and can put you at risk of further injury.  · If it is harder than usual to remember things, write them down.  · If you are easily distracted, try to do one thing at a time. For example, do not try to watch TV while fixing dinner.  · Talk with family members or close friends when making important decisions.  · Keep all follow-up appointments. Repeated evaluation of your symptoms is recommended for your recovery.  · Watch your symptoms and tell others to do the same. Complications sometimes occur after a concussion. Older adults with a brain injury may have a higher risk of serious complications, such as a blood clot on the brain.  · Tell your teachers, school nurse, school counselor, coach, athletic trainer, or work manager about your injury, symptoms, and restrictions. Tell them about what you can or cannot do. They should watch for:    Increased problems with attention or concentration.    Increased difficulty remembering or learning new information.    Increased time needed to complete tasks or assignments.    Increased irritability or decreased ability to cope with stress.    Increased symptoms.  · Rest. Rest helps the brain to heal. Make sure you:    Get plenty of sleep at night. Avoid staying up late at night.    Keep the same bedtime hours on weekends and weekdays.    Rest during the day. Take daytime naps or rest breaks when you feel tired.  · Limit activities that require a lot of thought or concentration. These include:    Doing homework or job-related  work.    Watching TV.    Working on the computer.  · Avoid any situation where there is potential for another head injury (football, hockey, soccer, basketball, martial arts, downhill snow sports and horseback riding). Your condition will get worse every time you experience a concussion. You should avoid these activities until you are evaluated by the appropriate follow-up health care providers.  Returning To Your Regular Activities  You will need to return to your normal activities slowly, not all at once. You must give your body and brain enough time for recovery.  · Do not return to sports or other athletic activities until your health care provider tells you it is safe to do so.  · Ask   your health care provider when you can drive, ride a bicycle, or operate heavy machinery. Your ability to react may be slower after a brain injury. Never do these activities if you are dizzy.  · Ask your health care provider about when you can return to work or school.  Preventing Another Concussion  It is very important to avoid another brain injury, especially before you have recovered. In rare cases, another injury can lead to permanent brain damage, brain swelling, or death. The risk of this is greatest during the first 7-10 days after a head injury. Avoid injuries by:  · Wearing a seat belt when riding in a car.  · Drinking alcohol only in moderation.  · Wearing a helmet when biking, skiing, skateboarding, skating, or doing similar activities.  · Avoiding activities that could lead to a second concussion, such as contact or recreational sports, until your health care provider says it is okay.  · Taking safety measures in your home.    Remove clutter and tripping hazards from floors and stairways.    Use grab bars in bathrooms and handrails by stairs.    Place non-slip mats on floors and in bathtubs.    Improve lighting in dim areas.  SEEK MEDICAL CARE IF:  · You have increased problems paying attention or  concentrating.  · You have increased difficulty remembering or learning new information.  · You need more time to complete tasks or assignments than before.  · You have increased irritability or decreased ability to cope with stress.  · You have more symptoms than before.  Seek medical care if you have any of the following symptoms for more than 2 weeks after your injury:  · Lasting (chronic) headaches.  · Dizziness or balance problems.  · Nausea.  · Vision problems.  · Increased sensitivity to noise or light.  · Depression or mood swings.  · Anxiety or irritability.  · Memory problems.  · Difficulty concentrating or paying attention.  · Sleep problems.  · Feeling tired all the time.  SEEK IMMEDIATE MEDICAL CARE IF:  · You have severe or worsening headaches. These may be a sign of a blood clot in the brain.  · You have weakness (even if only in one hand, leg, or part of the face).  · You have numbness.  · You have decreased coordination.  · You vomit repeatedly.  · You have increased sleepiness.  · One pupil is larger than the other.  · You have convulsions.  · You have slurred speech.  · You have increased confusion. This may be a sign of a blood clot in the brain.  · You have increased restlessness, agitation, or irritability.  · You are unable to recognize people or places.  · You have neck pain.  · It is difficult to wake you up.  · You have unusual behavior changes.  · You lose consciousness.  MAKE SURE YOU:  · Understand these instructions.  · Will watch your condition.  · Will get help right away if you are not doing well or get worse.     This information is not intended to replace advice given to you by your health care provider. Make sure you discuss any questions you have with your health care provider.     Document Released: 08/13/2003 Document Revised: 06/13/2014 Document Reviewed: 12/13/2012  Elsevier Interactive Patient Education ©2016 [employer]

## 2015-08-28 NOTE — ED Notes (Signed)
Pt states "tuesday a garage door fell on my head, garage door at work hit my head" Pt denies LOC. Pt came here and was told that if pain didn't go away to come back. Pt also c/o intermittent nausea. Neuro intact.

## 2015-08-28 NOTE — ED Provider Notes (Signed)
CSN: [id number]     Arrival date & time 08/28/15  2039 History  By signing my name below, I, Doreatha Martin, attest that this documentation has been prepared under the direction and in the presence of Roxy Horseman, PA-C. Electronically Signed: Doreatha Martin, ED Scribe. 08/28/2015. 9:16 PM.    Chief Complaint  Patient presents with  . Headache   The history is provided by the patient. No language interpreter was used.   HPI Comments: Jordan Andrews is a 20 y.o. male with h/o migraine who presents to the Emergency Department with a chief complaint of moderate, persistent HA onset 3 days ago s/p injury with associated nausea. Pt states he was hit on the head 3 days ago with a garage door. Denies LOC. He reports that he was evaluated in the ED after this injury and was discharged in stable condition with rx for tylenol. He reports that his HA has persisted despite taking tylenol. Pt is ambulatory without difficulty. Per pt, he was able to go back to work. He denies blurred vision, difficulty speaking, weakness or numbness to upper or lower extremities, emesis.    Past Medical History  Diagnosis Date  . Anxiety   . Migraine    Past Surgical History  Procedure Laterality Date  . Wisdom tooth extraction     No family history on file. Social History  Substance Use Topics  . Smoking status: Never Smoker   . Smokeless tobacco: None  . Alcohol Use: No    Review of Systems  Eyes: Negative for visual disturbance.  Gastrointestinal: Positive for nausea. Negative for vomiting.  Neurological: Positive for headaches. Negative for dizziness, syncope, speech difficulty, weakness, light-headedness and numbness.   Allergies  Shrimp  Home Medications   Prior to Admission medications   Medication Sig Start Date End Date Taking? Authorizing Provider  albuterol (PROVENTIL HFA;VENTOLIN HFA) 108 (90 BASE) MCG/ACT inhaler Two inhalations every 4 hours prn cough and wheeze 03/25/14   Hayden Rasmussen, NP   amoxicillin-clavulanate (AUGMENTIN) 875-125 MG tablet Take 1 tablet by mouth 2 (two) times daily.    Historical Provider, MD  ibuprofen (ADVIL,MOTRIN) 800 MG tablet Take 800 mg by mouth every 8 (eight) hours as needed for headache.    Historical Provider, MD  metoCLOPramide (REGLAN) 10 MG tablet Take 1 tablet (10 mg total) by mouth every 8 (eight) hours as needed (headache). 06/12/15   Tomasita Crumble, MD   BP 132/73 mmHg  Pulse 95  Temp(Src) 99 F (37.2 C)  Resp 18  SpO2 98% Physical Exam  Constitutional: He is oriented to person, place, and time. He appears well-developed and well-nourished.  HENT:  Head: Normocephalic and atraumatic.  Right Ear: External ear normal.  Left Ear: External ear normal.  Eyes: Conjunctivae and EOM are normal. Pupils are equal, round, and reactive to light.  Neck: Normal range of motion. Neck supple.  No pain with neck flexion, no meningismus  Cardiovascular: Normal rate, regular rhythm and normal heart sounds.  Exam reveals no gallop and no friction rub.   No murmur heard. Pulmonary/Chest: Effort normal and breath sounds normal. No respiratory distress. He has no wheezes. He has no rales. He exhibits no tenderness.  Abdominal: Soft. He exhibits no distension and no mass. There is no tenderness. There is no rebound and no guarding.  Musculoskeletal: Normal range of motion. He exhibits no edema or tenderness.  Normal gait.  Neurological: He is alert and oriented to person, place, and time.  CN 3-12 intact, normal  finger to nose, no pronator drift, normal finger counting, normal peripheral fields, normal gait, sensation and strength intact bilaterally.  Skin: Skin is warm and dry.  Psychiatric: He has a normal mood and affect. His behavior is normal. Judgment and thought content normal.  Nursing note and vitals reviewed.    ED Course  Procedures (including critical care time) DIAGNOSTIC STUDIES: Oxygen Saturation is 98% on RA, normal by my interpretation.     COORDINATION OF CARE: 9:14 PM Discussed treatment plan with pt at bedside which includes conservative home therapies, reduced screen time and pt agreed to plan.    MDM   Final diagnoses:  Concussion, without loss of consciousness, subsequent encounter    Patient symptoms consistent with concussion. No vomiting. No focal neurological deficits on physical exam.  Pt observed in the ED.  CT is not indicated at this time. Discussed symptoms of post concussive syndrome and reasons to return to the emergency department including any new  severe headaches, disequilibrium, vomiting, double vision, extremity weakness, difficulty ambulating, or any other concerning symptoms. Patient will be discharged with information pertaining to diagnosis. Pt is safe for discharge at this time.   I personally performed the services described in this documentation, which was scribed in my presence. The recorded information has been reviewed and is accurate.     Roxy HorsemanRobert Eilleen Davoli, PA-C 08/28/15 2137  Benjiman CoreNathan Pickering, MD 08/28/15 [phone]

## 2015-09-28 ENCOUNTER — Encounter (HOSPITAL_COMMUNITY): Payer: Self-pay | Admitting: Emergency Medicine

## 2015-09-28 ENCOUNTER — Ambulatory Visit (HOSPITAL_COMMUNITY)
Admission: EM | Admit: 2015-09-28 | Discharge: 2015-09-28 | Disposition: A | Discharge status: Home / Self Care | Payer: BLUE CROSS/BLUE SHIELD | Attending: Family Medicine | Admitting: Family Medicine

## 2015-09-28 DIAGNOSIS — H6123 Impacted cerumen, bilateral: Secondary | ICD-10-CM

## 2015-09-28 DIAGNOSIS — S01311A Laceration without foreign body of right ear, initial encounter: Secondary | ICD-10-CM | POA: Diagnosis not present

## 2015-09-28 NOTE — Discharge Instructions (Signed)
Cerumen Impaction The structures of the external ear canal secrete a waxy substance known as cerumen. Excess cerumen can build up in the ear canal, causing a condition known as cerumen impaction. Cerumen impaction can cause ear pain and disrupt the function of the ear. The rate of cerumen production differs for each individual. In certain individuals, the configuration of the ear canal may decrease his or her ability to naturally remove cerumen. CAUSES Cerumen impaction is caused by excessive cerumen production or buildup. RISK FACTORS  Frequent use of swabs to clean ears.  Having narrow ear canals.  Having eczema.  Being dehydrated. SIGNS AND SYMPTOMS  Diminished hearing.  Ear drainage.  Ear pain.  Ear itch. TREATMENT Treatment may involve:  Over-the-counter or prescription ear drops to soften the cerumen.  Removal of cerumen by a health care provider. This may be done with:  Irrigation with warm water. This is the most common method of removal.  Ear curettes and other instruments.  Surgery. This may be done in severe cases. HOME CARE INSTRUCTIONS  Take medicines only as directed by your health care provider.  Do not insert objects into the ear with the intent of cleaning the ear. PREVENTION  Do not insert objects into the ear, even with the intent of cleaning the ear. Removing cerumen as a part of normal hygiene is not necessary, and the use of swabs in the ear canal is not recommended.  Drink enough water to keep your urine clear or pale yellow.  Control your eczema if you have it. SEEK MEDICAL CARE IF:  You develop ear pain.  You develop bleeding from the ear.  The cerumen does not clear after you use ear drops as directed.   This information is not intended to replace advice given to you by your health care provider. Make sure you discuss any questions you have with your health care provider.   Document Released: 06/30/2004 Document Revised: 06/13/2014  Document Reviewed: 01/07/2015 Elsevier Interactive Patient Education 2016 Elsevier Inc.  Nonsutured Laceration Care A laceration is a cut that goes through all layers of the skin and extends into the tissue that is right under the skin. This type of cut is usually stitched up (sutured) or closed with tape (adhesive strips) or skin glue shortly after the injury happens. However, if the wound is dirty or if several hours pass before medical treatment is provided, it is likely that germs (bacteria) will enter the wound. Closing a laceration after bacteria have entered it increases the risk of infection. In these cases, your health care provider may leave the laceration open (nonsutured) and cover it with a bandage. This type of treatment helps prevent infection and allows the wound to heal from the deepest layer of tissue damage up to the surface. An open fracture is a type of injury that may involve nonsutured lacerations. An open fracture is a break in a bone that happens along with one or more lacerations through the skin that is near the fracture site. HOW TO CARE FOR YOUR NONSUTURED LACERATION  Take or apply over-the-counter and prescription medicines only as told by your health care provider.  If you were prescribed an antibiotic medicine, take or apply it as told by your health care provider. Do not stop using the antibiotic even if your condition improves.  Clean the wound one time each day or as told by your health care provider.  Wash the wound with mild soap and water.  Rinse the wound with water  to remove all soap.  Pat your wound dry with a clean towel. Do not rub the wound.  Do not inject anything into the wound unless your health care provider told you to.  Change any bandages (dressings) as told by your health care provider. This includes changing the dressing if it gets wet, dirty, or starts to smell bad.  Keep the dressing dry until your health care provider says it can be  removed. Do not take baths, swim, or do anything that puts your wound underwater until your health care provider approves.  Raise (elevate) the injured area above the level of your heart while you are sitting or lying down, if possible.  Do not scratch or pick at the wound.  Check your wound every day for signs of infection. Watch for:  Redness, swelling, or pain.  Fluid, blood, or pus.  Keep all follow-up visits as told by your health care provider. This is important. SEEK MEDICAL CARE IF:  You received a tetanus and shot and you have swelling, severe pain, redness, or bleeding at the injection site.   You have a fever.  Your pain is not controlled with medicine.  You have increased redness, swelling, or pain at the site of your wound.  You have fluid, blood, or pus coming from your wound.  You notice a bad smell coming from your wound or your dressing.  You notice something coming out of the wound, such as wood or glass.  You notice a change in the color of your skin near your wound.  You develop a new rash.  You need to change the dressing frequently due to fluid, blood, or pus draining from the wound.  You develop numbness around your wound. SEEK IMMEDIATE MEDICAL CARE IF:  Your pain suddenly increases and is severe.  You develop severe swelling around the wound.  The wound is on your hand or foot and you cannot properly move a finger or toe.  The wound is on your hand or foot and you notice that your fingers or toes look pale or bluish.  You have a red streak going away from your wound.   This information is not intended to replace advice given to you by your health care provider. Make sure you discuss any questions you have with your health care provider.   Document Released: 04/20/2006 Document Revised: 10/07/2014 Document Reviewed: 05/19/2014 Elsevier Interactive Patient Education [employer]2016 Elsevier Inc.

## 2015-09-28 NOTE — ED Provider Notes (Signed)
CSN: [id number]     Arrival date & time 09/28/15  1612 History   First MD Initiated Contact with Patient 09/28/15 1638     Chief Complaint  Patient presents with  . Ear Laceration    right   (Consider location/radiation/quality/duration/timing/severity/associated sxs/prior Treatment) HPI Comments: 20 year old male that wears earrings. The right earring pulled loose from his right earlobe yesterday producing a forked, tansmural laceration. The split is approx 5 mm in length.   EAC's filled with cerumen and pt requests irrigation bilat   Past Medical History  Diagnosis Date  . Anxiety   . Migraine    Past Surgical History  Procedure Laterality Date  . Wisdom tooth extraction     History reviewed. No pertinent family history. Social History  Substance Use Topics  . Smoking status: Never Smoker   . Smokeless tobacco: None  . Alcohol Use: No    Review of Systems  Constitutional: Negative.   HENT: Negative for ear pain.        As per HPI  Musculoskeletal: Negative.   Skin: Positive for wound.  Neurological: Negative.     Allergies  Shrimp  Home Medications   Prior to Admission medications   Medication Sig Start Date End Date Taking? Authorizing Provider  albuterol (PROVENTIL HFA;VENTOLIN HFA) 108 (90 BASE) MCG/ACT inhaler Two inhalations every 4 hours prn cough and wheeze 03/25/14   Hayden Rasmussenavid Ronit Marczak, NP  amoxicillin-clavulanate (AUGMENTIN) 875-125 MG tablet Take 1 tablet by mouth 2 (two) times daily.    Historical Provider, MD  ibuprofen (ADVIL,MOTRIN) 800 MG tablet Take 800 mg by mouth every 8 (eight) hours as needed for headache.    Historical Provider, MD  metoCLOPramide (REGLAN) 10 MG tablet Take 1 tablet (10 mg total) by mouth every 8 (eight) hours as needed (headache). 06/12/15   Tomasita CrumbleAdeleke Oni, MD   Meds Ordered and Administered this Visit  Medications - No data to display  BP 128/72 mmHg  Pulse 76  Temp(Src) 97.5 F (36.4 C) (Oral)  Resp 16  SpO2 96% No data  found.   Physical Exam  Constitutional: He is oriented to person, place, and time. He appears well-developed and well-nourished. No distress.  HENT:  Ears:  Upper vertical brown line represents a keloid scar.    Bilat EAC's occluded with cerumen.  R earlobe with 5mm transmural laceration. Other than 2 small red points of viable injured tissue at the distal margins, the remaining skin surface of the laceration are covered with healed skin. These points are <751mm diameter. No signs of infection. No redness, swelling or tenderness. No purulence or drainage.  Eyes: EOM are normal.  Neck: Normal range of motion. Neck supple.  Cardiovascular: Normal rate.   Pulmonary/Chest: Effort normal. No respiratory distress.  Musculoskeletal: He exhibits no edema.  Neurological: He is alert and oriented to person, place, and time. He exhibits normal muscle tone.  Skin: Skin is warm and dry. He is not diaphoretic.  Psychiatric: He has a normal mood and affect.  Nursing note and vitals reviewed.   ED Course  .Ear Cerumen Removal Date/Time: 09/28/2015 6:02 PM Performed by: Phineas RealMABE, Royetta Probus Authorized by: Bradd CanaryKINDL, JAMES D Consent: Verbal consent obtained. Risks and benefits: risks, benefits and alternatives were discussed Consent given by: patient Patient understanding: patient states understanding of the procedure being performed Patient identity confirmed: verbally with patient Local anesthetic: none Location details: right ear Procedure type: irrigation Patient sedated: no Patient tolerance: Patient tolerated the procedure well with no immediate complications Comments:  Bilateral EAC's irrigated.   (including critical care time)  Labs Review Labs Reviewed - No data to display  Imaging Review No results found.   Visual Acuity Review  Right Eye Distance:   Left Eye Distance:   Bilateral Distance:    Right Eye Near:   Left Eye Near:    Bilateral Near:         MDM   1. Cerumen  impaction, bilateral   2. Ear lobe laceration, right, initial encounter    Post EAC irrigation R is cleared and TM is normal in appearance. L EAC with most wax removed, mild erythema to canal. No drainage. Keep right ear lobe clean, watch for infection. Return if needed.  For lobe repair Engineer, drilling.    Hayden Rasmussen, NP 09/28/15 [phone]

## 2015-09-28 NOTE — ED Notes (Signed)
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

## 2015-09-28 NOTE — ED Notes (Signed)
The patient's ear was cleaned with wound cleanser and bacitracin applied.

## 2015-11-16 ENCOUNTER — Ambulatory Visit (HOSPITAL_COMMUNITY)
Admission: EM | Admit: 2015-11-16 | Discharge: 2015-11-16 | Disposition: A | Discharge status: Home / Self Care | Payer: BLUE CROSS/BLUE SHIELD | Attending: Family Medicine | Admitting: Family Medicine

## 2015-11-16 ENCOUNTER — Encounter (HOSPITAL_COMMUNITY): Payer: Self-pay | Admitting: *Deleted

## 2015-11-16 DIAGNOSIS — H6122 Impacted cerumen, left ear: Secondary | ICD-10-CM | POA: Diagnosis not present

## 2015-11-16 DIAGNOSIS — J302 Other seasonal allergic rhinitis: Secondary | ICD-10-CM | POA: Diagnosis not present

## 2015-11-16 MED ORDER — IPRATROPIUM BROMIDE 0.06 % NA SOLN: 2.0000 | Freq: Four times a day (QID) | NASAL | Status: DC

## 2015-11-16 NOTE — Discharge Instructions (Signed)
Allergic Rhinitis °For nasal and head congestion may take Sudafed PE 10 mg every 4 hours as needed. °Saline nasal spray used frequently. °For drainage may use Allegra, Claritin or Zyrtec. If you need stronger medicine to stop drainage may take Chlor-Trimeton 2-4 mg every 4 hours. This may cause drowsiness. °Ibuprofen 600 mg every 6 hours as needed for pain, discomfort or fever. °Drink plenty of fluids and stay well-hydrated. ° °Allergic rhinitis is when the mucous membranes in the nose respond to allergens. Allergens are particles in the air that cause your body to have an allergic reaction. This causes you to release allergic antibodies. Through a chain of events, these eventually cause you to release histamine into the blood stream. Although meant to protect the body, it is this release of histamine that causes your discomfort, such as frequent sneezing, congestion, and an itchy, runny nose.  °CAUSES °Seasonal allergic rhinitis (hay fever) is caused by pollen allergens that may come from grasses, trees, and weeds. Year-round allergic rhinitis (perennial allergic rhinitis) is caused by allergens such as house dust mites, pet dander, and mold spores. °SYMPTOMS °· Nasal stuffiness (congestion). °· Itchy, runny nose with sneezing and tearing of the eyes. °DIAGNOSIS °Your health care provider can help you determine the allergen or allergens that trigger your symptoms. If you and your health care provider are unable to determine the allergen, skin or blood testing may be used. Your health care provider will diagnose your condition after taking your health history and performing a physical exam. Your health care provider may assess you for other related conditions, such as asthma, pink eye, or an ear infection. °TREATMENT °Allergic rhinitis does not have a cure, but it can be controlled by: °· Medicines that block allergy symptoms. These may include allergy shots, nasal sprays, and oral antihistamines. °· Avoiding the  allergen. °Hay fever may often be treated with antihistamines in pill or nasal spray forms. Antihistamines block the effects of histamine. There are over-the-counter medicines that may help with nasal congestion and swelling around the eyes. Check with your health care provider before taking or giving this medicine. °If avoiding the allergen or the medicine prescribed do not work, there are many new medicines your health care provider can prescribe. Stronger medicine may be used if initial measures are ineffective. Desensitizing injections can be used if medicine and avoidance does not work. Desensitization is when a patient is given ongoing shots until the body becomes less sensitive to the allergen. Make sure you follow up with your health care provider if problems continue. °HOME CARE INSTRUCTIONS °It is not possible to completely avoid allergens, but you can reduce your symptoms by taking steps to limit your exposure to them. It helps to know exactly what you are allergic to so that you can avoid your specific triggers. °SEEK MEDICAL CARE IF: °· You have a fever. °· You develop a cough that does not stop easily (persistent). °· You have shortness of breath. °· You start wheezing. °· Symptoms interfere with normal daily activities. °  °This information is not intended to replace advice given to you by your health care provider. Make sure you discuss any questions you have with your health care provider. °  °Document Released: 02/15/2001 Document Revised: 06/13/2014 Document Reviewed: 01/28/2013 °Elsevier Interactive Patient Education ©2016 Elsevier Inc. ° °

## 2015-11-16 NOTE — ED Notes (Signed)
Pt  Reports   Symptoms  Of    Congestion     X   1   Week   Symptoms  Not  releived  By  otc  meds          Pt   Sitting  Upright  On  The  Exam table   Speaking in   Complete  sentances

## 2015-11-16 NOTE — ED Provider Notes (Signed)
CSN: [id number]     Arrival date & time 11/16/15  1326 History   First MD Initiated Contact with Patient 11/16/15 1446     Chief Complaint  Patient presents with  . URI   (Consider location/radiation/quality/duration/timing/severity/associated sxs/prior Treatment) HPI Comments: 20 year old male complaining of runny nose, sneezing and cough. Denies earache, sore throat, fever, chills or perceived PND. He states he believes he has wax in his ears and will like to have his ears cleaned out.   Past Medical History  Diagnosis Date  . Anxiety   . Migraine    Past Surgical History  Procedure Laterality Date  . Wisdom tooth extraction     History reviewed. No pertinent family history. Social History  Substance Use Topics  . Smoking status: Never Smoker   . Smokeless tobacco: None  . Alcohol Use: No    Review of Systems  Constitutional: Negative for fever, diaphoresis, activity change and fatigue.  HENT: Positive for postnasal drip, rhinorrhea and sneezing. Negative for ear pain, facial swelling, sore throat and trouble swallowing.   Eyes: Negative for pain, discharge and redness.  Respiratory: Positive for cough. Negative for chest tightness and shortness of breath.   Cardiovascular: Negative.   Gastrointestinal: Negative.   Musculoskeletal: Negative.  Negative for neck pain and neck stiffness.  Neurological: Negative.   All other systems reviewed and are negative.   Allergies  Shrimp  Home Medications   Prior to Admission medications   Medication Sig Start Date End Date Taking? Authorizing Provider  ibuprofen (ADVIL,MOTRIN) 800 MG tablet Take 800 mg by mouth every 8 (eight) hours as needed for headache.    Historical Provider, MD  ipratropium (ATROVENT) 0.06 % nasal spray Place 2 sprays into both nostrils 4 (four) times daily. 11/16/15   Hayden Rasmussenavid Jhalil Silvera, NP   Meds Ordered and Administered this Visit  Medications - No data to display  BP 116/61 mmHg  Pulse 83  Temp(Src) 98.7  F (37.1 C) (Oral)  Resp 18  SpO2 100% No data found.   Physical Exam  Constitutional: He is oriented to person, place, and time. He appears well-developed and well-nourished. No distress.  HENT:  Mouth/Throat: No oropharyngeal exudate.  Bilateral TMs are normal. No effusions or erythema. There is moderate amount of cerumen in the left EAC.  Oropharynx is clear with scant clear PND.  Eyes: Conjunctivae are normal. Right eye exhibits no discharge. Left eye exhibits no discharge.  Neck: Normal range of motion. Neck supple.  Cardiovascular: Normal rate, regular rhythm and normal heart sounds.   Pulmonary/Chest: Effort normal. No respiratory distress. He has no wheezes. He has no rales.  Musculoskeletal: Normal range of motion. He exhibits no edema.  Lymphadenopathy:    He has no cervical adenopathy.  Neurological: He is alert and oriented to person, place, and time.  Skin: Skin is warm and dry. No rash noted.  Psychiatric: He has a normal mood and affect.  Nursing note and vitals reviewed.   ED Course  Procedures (including critical care time)  Labs Review Labs Reviewed - No data to display  Imaging Review No results found.   Visual Acuity Review  Right Eye Distance:   Left Eye Distance:   Bilateral Distance:    Right Eye Near:   Left Eye Near:    Bilateral Near:         MDM   1. Other seasonal allergic rhinitis   2. Excessive cerumen in ear canal, left    For nasal and head  congestion may take Sudafed PE 10 mg every 4 hours as needed. Saline nasal spray used frequently. For drainage may use Allegra, Claritin or Zyrtec. If you need stronger medicine to stop drainage may take Chlor-Trimeton 2-4 mg every 4 hours. This may cause drowsiness. Ibuprofen 600 mg every 6 hours as needed for pain, discomfort or fever. Drink plenty of fluids and stay well-hydrated.  Meds ordered this encounter  Medications  . ipratropium (ATROVENT) 0.06 % nasal spray    Sig: Place 2  sprays into both nostrils 4 (four) times daily.    Dispense:  15 mL    Refill:  0    Order Specific Question:  Supervising Provider    Answer:  Linna Hoff [[id number]]        Hayden Rasmussen, NP 11/16/15 1530

## 2015-11-23 ENCOUNTER — Ambulatory Visit (HOSPITAL_COMMUNITY)
Admission: EM | Admit: 2015-11-23 | Discharge: 2015-11-23 | Disposition: A | Discharge status: Home / Self Care | Payer: BLUE CROSS/BLUE SHIELD | Attending: Family Medicine | Admitting: Family Medicine

## 2015-11-23 ENCOUNTER — Encounter (HOSPITAL_COMMUNITY): Payer: Self-pay | Admitting: Emergency Medicine

## 2015-11-23 DIAGNOSIS — W57XXXA Bitten or stung by nonvenomous insect and other nonvenomous arthropods, initial encounter: Secondary | ICD-10-CM | POA: Diagnosis not present

## 2015-11-23 DIAGNOSIS — T148 Other injury of unspecified body region: Secondary | ICD-10-CM

## 2015-11-23 MED ORDER — PREDNISONE 20 MG PO TABS: ORAL_TABLET | ORAL | Status: DC

## 2015-11-23 MED ORDER — TRIAMCINOLONE ACETONIDE 0.1 % EX CREA: 1.0000 "application " | TOPICAL_CREAM | Freq: Two times a day (BID) | CUTANEOUS | Status: DC

## 2015-11-23 NOTE — ED Notes (Signed)
[facility] a [facility] of a rash on the upper part of his body that has been there for 3 days.

## 2015-11-23 NOTE — ED Provider Notes (Signed)
CSN: [id number]     Arrival date & time 11/23/15  1402 History   First MD Initiated Contact with Patient 11/23/15 1553     Chief Complaint  Patient presents with  . Rash   (Consider location/radiation/quality/duration/timing/severity/associated sxs/prior Treatment) HPI Comments: 20 year old Male complaining of some bumps on his body that are pruritic. He noticed them about 2 days ago. There are several papules and avoid slightly raised lesions to the left lateral abdomen near the waistline. Just a few other lesions around the waistline and to the left upper shoulder.  Patient is a 20 y.o. male presenting with rash.  Rash Associated symptoms: no myalgias     Past Medical History  Diagnosis Date  . Anxiety   . Migraine    Past Surgical History  Procedure Laterality Date  . Wisdom tooth extraction     History reviewed. No pertinent family history. Social History  Substance Use Topics  . Smoking status: Never Smoker   . Smokeless tobacco: None  . Alcohol Use: No    Review of Systems  Constitutional: Negative.   HENT: Negative.   Respiratory: Negative.   Musculoskeletal: Negative for myalgias and back pain.  Skin: Positive for rash.  Neurological: Negative.   All other systems reviewed and are negative.   Allergies  Shrimp  Home Medications   Prior to Admission medications   Medication Sig Start Date End Date Taking? Authorizing Provider  ibuprofen (ADVIL,MOTRIN) 800 MG tablet Take 800 mg by mouth every 8 (eight) hours as needed for headache.    Historical Provider, MD  ipratropium (ATROVENT) 0.06 % nasal spray Place 2 sprays into both nostrils 4 (four) times daily. 11/16/15   Hayden Rasmussenavid Jiovani Mccammon, NP  predniSONE (DELTASONE) 20 MG tablet Take 3 tabs po on first day, 2 tabs second day, 2 tabs third day, 1 tab fourth day, 1 tab 5th day. Take with food. 11/23/15   Hayden Rasmussenavid Zaquan Duffner, NP  triamcinolone cream (KENALOG) 0.1 % Apply 1 application topically 2 (two) times daily. 11/23/15   Hayden Rasmussenavid  Lakia Gritton, NP   Meds Ordered and Administered this Visit  Medications - No data to display  BP 110/59 mmHg  Pulse 73  Temp(Src) 98.7 F (37.1 C) (Oral)  Resp 18  SpO2 100% No data found.   Physical Exam  Constitutional: He appears well-developed and well-nourished. No distress.  HENT:  Head: Normocephalic and atraumatic.  Mouth/Throat: Oropharynx is clear and moist.  Eyes: EOM are normal.  Neck: Normal range of motion. Neck supple.  Cardiovascular: Normal rate and regular rhythm.   Pulmonary/Chest: Effort normal and breath sounds normal.  Musculoskeletal: Normal range of motion. He exhibits no edema.  Neurological: He is alert.  Skin: Skin is warm and dry.  Few small papules and slightly raised ovoid lesions to the left lower lateral abdomen around the left iliac crest. A couple of more lesions to the right lateral abdomen and back around the belt line. 2-3 small lesions to the left upper posterior shoulder.  Psychiatric: He has a normal mood and affect.  Nursing note and vitals reviewed.   ED Course  Procedures (including critical care time)  Labs Review Labs Reviewed - No data to display  Imaging Review No results found.   Visual Acuity Review  Right Eye Distance:   Left Eye Distance:   Bilateral Distance:    Right Eye Near:   Left Eye Near:    Bilateral Near:         MDM   1. Insect  bites    Apply the prescription cream as directed. You may also apply Benadryl cream in between times and/or Caladryl lotion. Do not apply these at the same time you apply the triamcinolone. Meds ordered this encounter  Medications  . triamcinolone cream (KENALOG) 0.1 %    Sig: Apply 1 application topically 2 (two) times daily.    Dispense:  30 g    Refill:  0    Order Specific Question:  Supervising Provider    Answer:  Linna Hoff [phone]  . predniSONE (DELTASONE) 20 MG tablet    Sig: Take 3 tabs po on first day, 2 tabs second day, 2 tabs third day, 1 tab fourth day,  1 tab 5th day. Take with food.    Dispense:  9 tablet    Refill:  0    Order Specific Question:  Supervising Provider    Answer:  Linna Hoff [[id number]]       Hayden Rasmussen, NP 11/23/15 [phone]

## 2015-12-02 ENCOUNTER — Ambulatory Visit (HOSPITAL_COMMUNITY)
Admission: EM | Admit: 2015-12-02 | Discharge: 2015-12-02 | Disposition: A | Discharge status: Home / Self Care | Payer: BLUE CROSS/BLUE SHIELD | Attending: Family Medicine | Admitting: Family Medicine

## 2015-12-02 ENCOUNTER — Encounter (HOSPITAL_COMMUNITY): Payer: Self-pay | Admitting: Emergency Medicine

## 2015-12-02 DIAGNOSIS — R05 Cough: Secondary | ICD-10-CM

## 2015-12-02 DIAGNOSIS — L089 Local infection of the skin and subcutaneous tissue, unspecified: Secondary | ICD-10-CM

## 2015-12-02 DIAGNOSIS — L0889 Other specified local infections of the skin and subcutaneous tissue: Secondary | ICD-10-CM | POA: Diagnosis not present

## 2015-12-02 DIAGNOSIS — R0982 Postnasal drip: Secondary | ICD-10-CM | POA: Diagnosis not present

## 2015-12-02 DIAGNOSIS — R059 Cough, unspecified: Secondary | ICD-10-CM

## 2015-12-02 MED ORDER — CHLORPHENIRAMINE-DM 4-30 MG PO TABS: ORAL_TABLET | ORAL | Status: DC

## 2015-12-02 NOTE — Discharge Instructions (Signed)

## 2015-12-02 NOTE — ED Notes (Signed)
Patient reports a bump just above right knee.  Noticed this on Saturday night .  Reports accidentally rubbing this bump last night and it popped.  Today knee is sore.  Wound looks unremarkable-no redness, no swelling

## 2015-12-02 NOTE — ED Provider Notes (Signed)
CSN: [id number]     Arrival date & time 12/02/15  1928 History   First MD Initiated Contact with Patient 12/02/15 1952     Chief Complaint  Patient presents with  . Wound Check   (Consider location/radiation/quality/duration/timing/severity/associated sxs/prior Treatment) HPI Comments: 20 year old male states he felt a bump to the right distal thigh to 3 days ago. It then formed what he was trying to describe as a pustule. He scratched it and it ruptured. It is now healing with secondary intention. He states there is some underlying soreness.  Second complaint is of cough that is worse at night. He is having to clear his throat frequently.   Past Medical History  Diagnosis Date  . Anxiety   . Migraine    Past Surgical History  Procedure Laterality Date  . Wisdom tooth extraction     No family history on file. Social History  Substance Use Topics  . Smoking status: Never Smoker   . Smokeless tobacco: None  . Alcohol Use: No    Review of Systems  Constitutional: Negative.   HENT: Positive for postnasal drip.   Respiratory: Positive for cough. Negative for shortness of breath.   Gastrointestinal: Negative.   Musculoskeletal: Negative.   Skin: Positive for wound.  Neurological: Negative.   All other systems reviewed and are negative.   Allergies  Shrimp  Home Medications   Prior to Admission medications   Medication Sig Start Date End Date Taking? Authorizing Provider  Chlorpheniramine-DM 4-30 MG TABS 1 tab po q 6h prn cough and drainage. May cause drowsiness 12/02/15   Hayden Rasmussenavid Georg Ang, NP  ibuprofen (ADVIL,MOTRIN) 800 MG tablet Take 800 mg by mouth every 8 (eight) hours as needed for headache.    Historical Provider, MD  ipratropium (ATROVENT) 0.06 % nasal spray Place 2 sprays into both nostrils 4 (four) times daily. 11/16/15   Hayden Rasmussenavid Cecille Mcclusky, NP   Meds Ordered and Administered this Visit  Medications - No data to display  BP 117/71 mmHg  Pulse 81  Temp(Src) 98.1 F (36.7  C) (Oral)  Resp 17  SpO2 99% No data found.   Physical Exam  Constitutional: He is oriented to person, place, and time. He appears well-developed and well-nourished. No distress.  Eyes: EOM are normal.  Neck: Normal range of motion. Neck supple.  Cardiovascular: Normal rate.   Pulmonary/Chest: Effort normal. No respiratory distress.  Musculoskeletal: He exhibits no edema.  Neurological: He is alert and oriented to person, place, and time. He exhibits normal muscle tone.  Skin: Skin is warm and dry.  The annular lesion to the right distal anterior thigh is approximately 1 cm diameter. It is healing well by secondary intention. There are no signs of infection. No erythema, swelling, drainage, bleeding, lymphangitis or induration. Minor soreness/tenderness surrounding the lesion.  Psychiatric: He has a normal mood and affect.  Nursing note and vitals reviewed.   ED Course  Procedures (including critical care time)  Labs Review Labs Reviewed - No data to display  Imaging Review No results found.   Visual Acuity Review  Right Eye Distance:   Left Eye Distance:   Bilateral Distance:    Right Eye Near:   Left Eye Near:    Bilateral Near:         MDM   1. Skin pustule   2. PND (post-nasal drip)   3. Cough    Reassurance, heat to thigh. Wound nearly healed. No signs of infection. Meds ordered this encounter  Medications  .  Chlorpheniramine-DM 4-30 MG TABS    Sig: 1 tab po q 6h prn cough and drainage. May cause drowsiness    Dispense:  24 each    Refill:  0    Order Specific Question:  Supervising Provider    Answer:  Linna HoffKINDL, JAMES D [[id number]]       Hayden Rasmussenavid Ota Ebersole, NP 12/02/15 2038

## 2016-02-13 ENCOUNTER — Ambulatory Visit (HOSPITAL_COMMUNITY)
Admission: EM | Admit: 2016-02-13 | Discharge: 2016-02-13 | Disposition: A | Discharge status: Home / Self Care | Payer: BLUE CROSS/BLUE SHIELD | Attending: Internal Medicine | Admitting: Internal Medicine

## 2016-02-13 ENCOUNTER — Encounter (HOSPITAL_COMMUNITY): Payer: Self-pay | Admitting: *Deleted

## 2016-02-13 ENCOUNTER — Ambulatory Visit (INDEPENDENT_AMBULATORY_CARE_PROVIDER_SITE_OTHER): Payer: BLUE CROSS/BLUE SHIELD

## 2016-02-13 DIAGNOSIS — M76821 Posterior tibial tendinitis, right leg: Secondary | ICD-10-CM

## 2016-02-13 MED ORDER — PREDNISONE 20 MG PO TABS: 60.0000 mg | ORAL_TABLET | Freq: Every day | ORAL | 0 refills | Status: DC

## 2016-02-13 MED ORDER — TRAMADOL HCL 50 MG PO TABS: 50.0000 mg | ORAL_TABLET | Freq: Four times a day (QID) | ORAL | 0 refills | Status: DC | PRN

## 2016-02-13 NOTE — ED Notes (Signed)
Updated on wait. 

## 2016-02-13 NOTE — ED Provider Notes (Signed)
CSN: [id number]     Arrival date & time 02/13/16  1454 History   First MD Initiated Contact with Patient 02/13/16 1810     Chief Complaint  Patient presents with  . Ankle Pain  . Foot Pain   (Consider location/radiation/quality/duration/timing/severity/associated sxs/prior Treatment)  HPI   Patient is a 20 year old male presenting today with complaints of right ankle/foot pain that started this morning when he woke up. Patient wears steel toed boots to work and denies any known injury or previous problem. Difficulty bearing weight. Denies significant medical history or allergies to medications - allergy to shrimp noted.    Past Medical History:  Diagnosis Date  . Anxiety   . Migraine    Past Surgical History:  Procedure Laterality Date  . WISDOM TOOTH EXTRACTION     No family history on file. Social History  Substance Use Topics  . Smoking status: Never Smoker  . Smokeless tobacco: Never Used  . Alcohol use No    Review of Systems  Constitutional: Negative.   HENT: Negative.   Eyes: Negative.   Respiratory: Negative.   Cardiovascular: Negative.   Gastrointestinal: Negative.   Endocrine: Negative.   Genitourinary: Negative.   Musculoskeletal: Negative.   Skin: Negative.  Negative for color change and wound.  Allergic/Immunologic: Negative.   Neurological: Negative.   Hematological: Negative.   Psychiatric/Behavioral: Negative.     Allergies  Shrimp [shellfish allergy]  Home Medications   Prior to Admission medications   Medication Sig Start Date End Date Taking? Authorizing Provider  ibuprofen (ADVIL,MOTRIN) 800 MG tablet Take 800 mg by mouth every 8 (eight) hours as needed for headache.   Yes Historical Provider, MD  Chlorpheniramine-DM 4-30 MG TABS 1 tab po q 6h prn cough and drainage. May cause drowsiness 12/02/15   Hayden Rasmussen, NP  ipratropium (ATROVENT) 0.06 % nasal spray Place 2 sprays into both nostrils 4 (four) times daily. 11/16/15   Hayden Rasmussen, NP   predniSONE (DELTASONE) 20 MG tablet Take 3 tablets (60 mg total) by mouth daily. 02/13/16   Servando Salina, NP  traMADol (ULTRAM) 50 MG tablet Take 1 tablet (50 mg total) by mouth every 6 (six) hours as needed. 02/13/16   Servando Salina, NP   Meds Ordered and Administered this Visit  Medications - No data to display  BP 154/88 (BP Location: Left Arm)   Pulse 65   Temp 97.9 F (36.6 C) (Oral)   Resp 12   SpO2 100%  No data found.   Physical Exam  Constitutional: He appears well-developed and well-nourished. No distress.  Cardiovascular: Normal rate, regular rhythm, normal heart sounds and intact distal pulses.  Exam reveals no gallop and no friction rub.   No murmur heard. Pulmonary/Chest: Effort normal and breath sounds normal. No respiratory distress. He has no wheezes. He has no rales. He exhibits no tenderness.  Musculoskeletal: Normal range of motion. He exhibits tenderness. He exhibits no edema.  The patient is able to bear weight and ambulate with minimal pain. The right ankle is without obvious asymmetry or deformity as compared to the left ankle. Patient can flex and extend, inversion/eversion. No obvious surface trauma, no ecchymosis or swelling present over the lateral or medial malleolus. Good dorsalis pedis and posterior tibial pulses and sensation to light touch normal. Talar tilt test is negative for ligament laxity to valgus or varus stress. Negative anterior drawer. Patient has tenderness to palpation along posterior tibialis ligament.    Skin: Skin is warm and  dry. He is not diaphoretic.  Nursing note and vitals reviewed.   Urgent Care Course   Clinical Course    Procedures (including critical care time)  Labs Review Labs Reviewed - No data to display  Imaging Review Dg Ankle Complete Right  Result Date: 02/13/2016 CLINICAL DATA:  Ankle and foot pain. EXAM: RIGHT ANKLE - COMPLETE 3+ VIEW COMPARISON:  None. FINDINGS: There is no evidence of fracture,  dislocation, or joint effusion. There is no evidence of arthropathy or other focal bone abnormality. Soft tissues are unremarkable. IMPRESSION: Negative. Electronically Signed   By: Gerome Samavid  Williams III M.D   On: 02/13/2016 17:27   Dg Foot Complete Right  Result Date: 02/13/2016 CLINICAL DATA:  Pain without known trauma. EXAM: RIGHT FOOT COMPLETE - 3+ VIEW COMPARISON:  None. FINDINGS: There is no evidence of fracture or dislocation. There is no evidence of arthropathy or other focal bone abnormality. Soft tissues are unremarkable. IMPRESSION: Negative. Electronically Signed   By: Gerome Samavid  Williams III M.D   On: 02/13/2016 17:28   Patient placed in cam walker boot, advised to follow up with ortho if no improvement over next 14 days.   MDM   1. Tibialis posterior tendinitis, right    Meds ordered this encounter  Medications  . predniSONE (DELTASONE) 20 MG tablet    Sig: Take 3 tablets (60 mg total) by mouth daily.    Dispense:  15 tablet    Refill:  0  . traMADol (ULTRAM) 50 MG tablet    Sig: Take 1 tablet (50 mg total) by mouth every 6 (six) hours as needed.    Dispense:  15 tablet    Refill:  0   The usual and customary discharge instructions and warnings were given.  The patient verbalizes understanding and agrees to plan of care.        Servando Salinaatherine H Audreyana Huntsberry, NP 02/13/16 1829

## 2016-02-13 NOTE — ED Triage Notes (Signed)
C/O waking with right lateral ankle and right lateral foot pain 2 days ago.  Denies any injuries or unusual activity; but states he works in [employer] to[employer].  Has tried icing and taking IBU without any relief.

## 2016-03-02 ENCOUNTER — Encounter (HOSPITAL_COMMUNITY): Payer: Self-pay | Admitting: Emergency Medicine

## 2016-03-02 ENCOUNTER — Ambulatory Visit (HOSPITAL_COMMUNITY)
Admission: EM | Admit: 2016-03-02 | Discharge: 2016-03-02 | Disposition: A | Discharge status: Home / Self Care | Payer: BLUE CROSS/BLUE SHIELD

## 2016-03-02 DIAGNOSIS — M79671 Pain in right foot: Secondary | ICD-10-CM | POA: Diagnosis not present

## 2016-03-02 NOTE — Discharge Instructions (Signed)
Suggest padding shoe.  Alternating boot and shoe for a couple of days to get foot used to walking in boots again.   If not steadily getting better, should see podiatry.

## 2016-03-02 NOTE — ED Triage Notes (Signed)
[facility] a [facility] of right foot pain that was initially evaluated on 02/13/2016. The patient stated that he was prescribed Tramadol and Prednisone and a boot to wear. The patient stated that he completed the Prednisone and wore the boot but he is having continued pain and can not have light duty work at his job.

## 2016-03-19 IMAGING — DX DG CHEST 2V
2 series · 2 of 2 positions shown · non-contrast
Comparison: Prior chest x-ray 09/19/2013

CLINICAL DATA: 18-year-old male with persistent cough. Recent
diagnosis of pneumonia.

EXAM:
CHEST  2 VIEW

[chest pa]
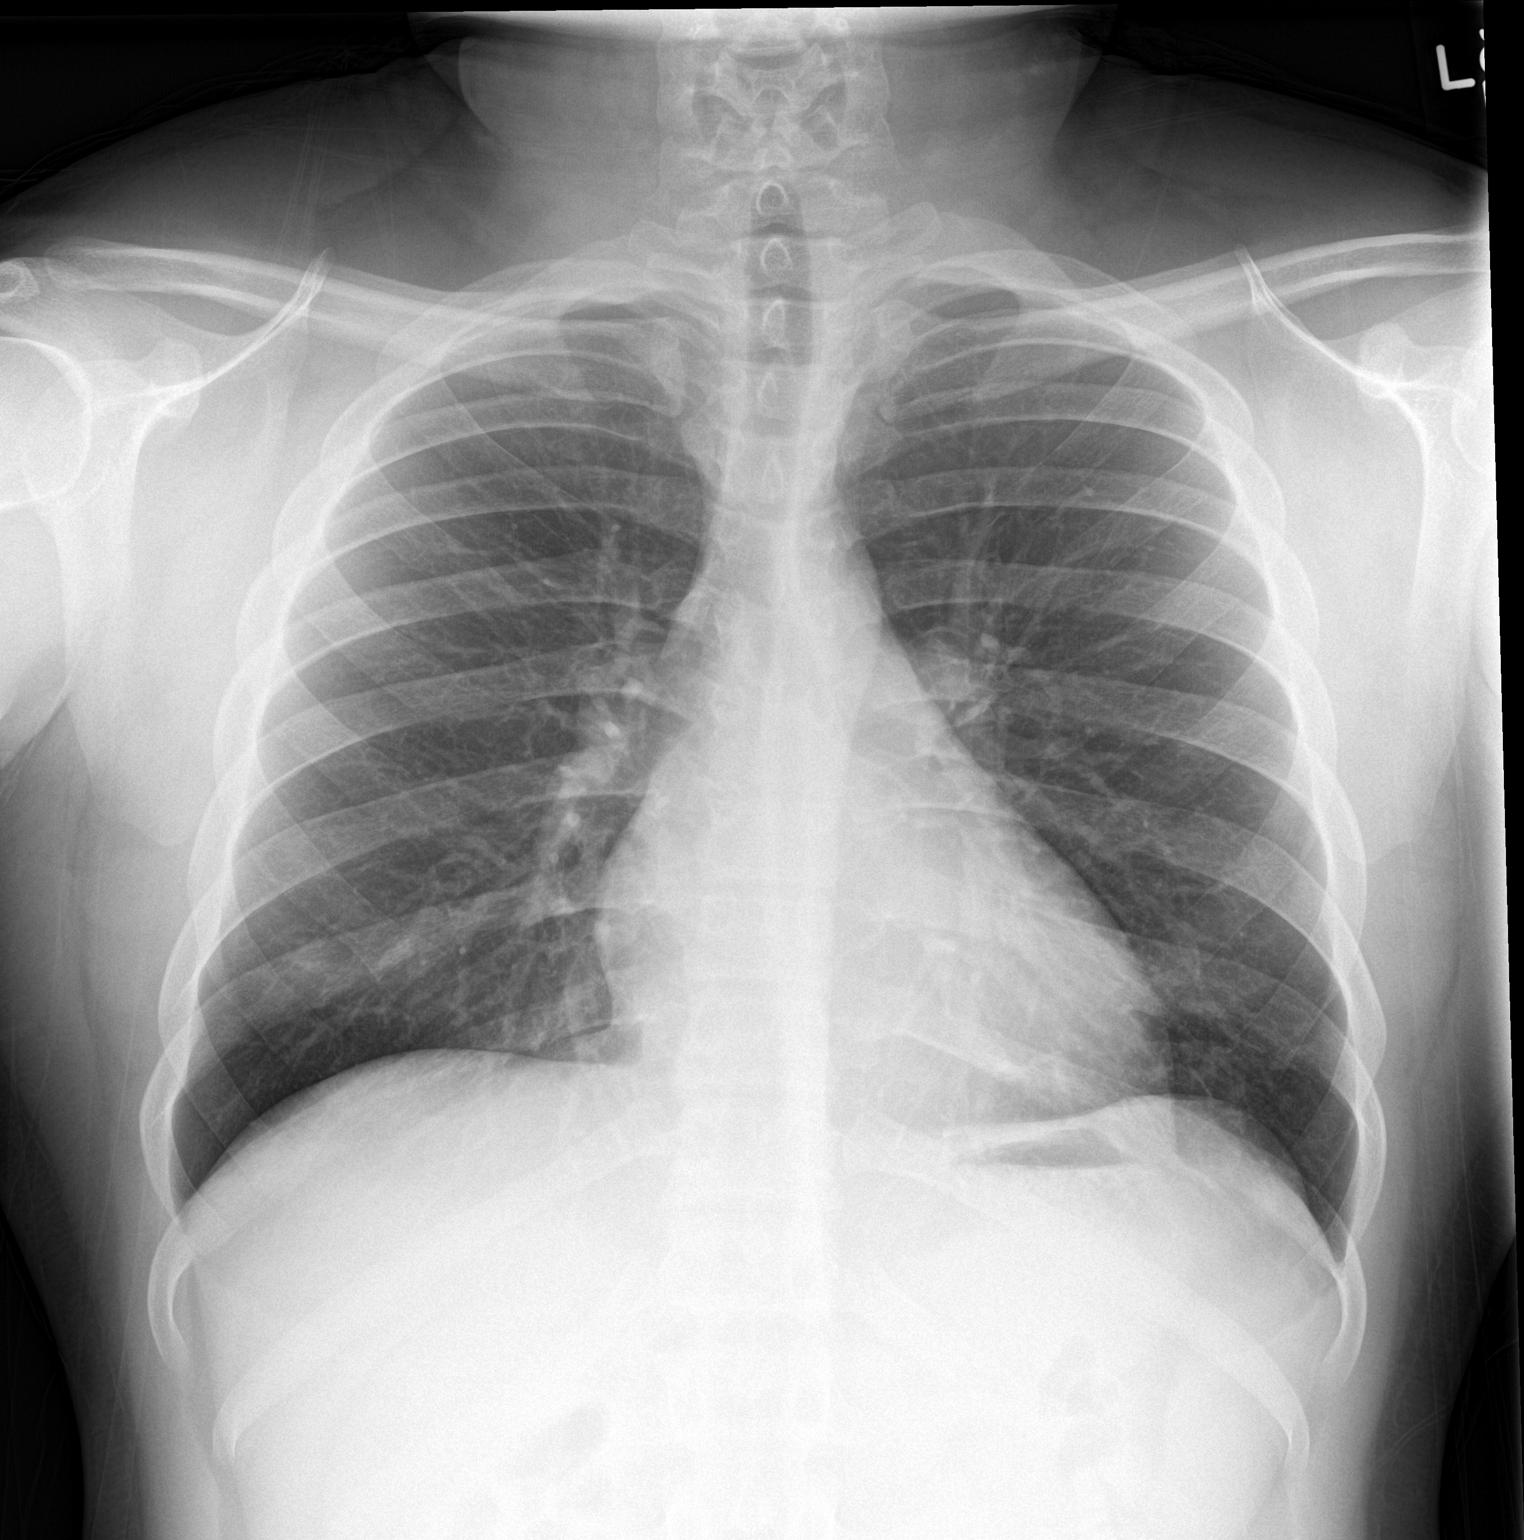

[chest lat]
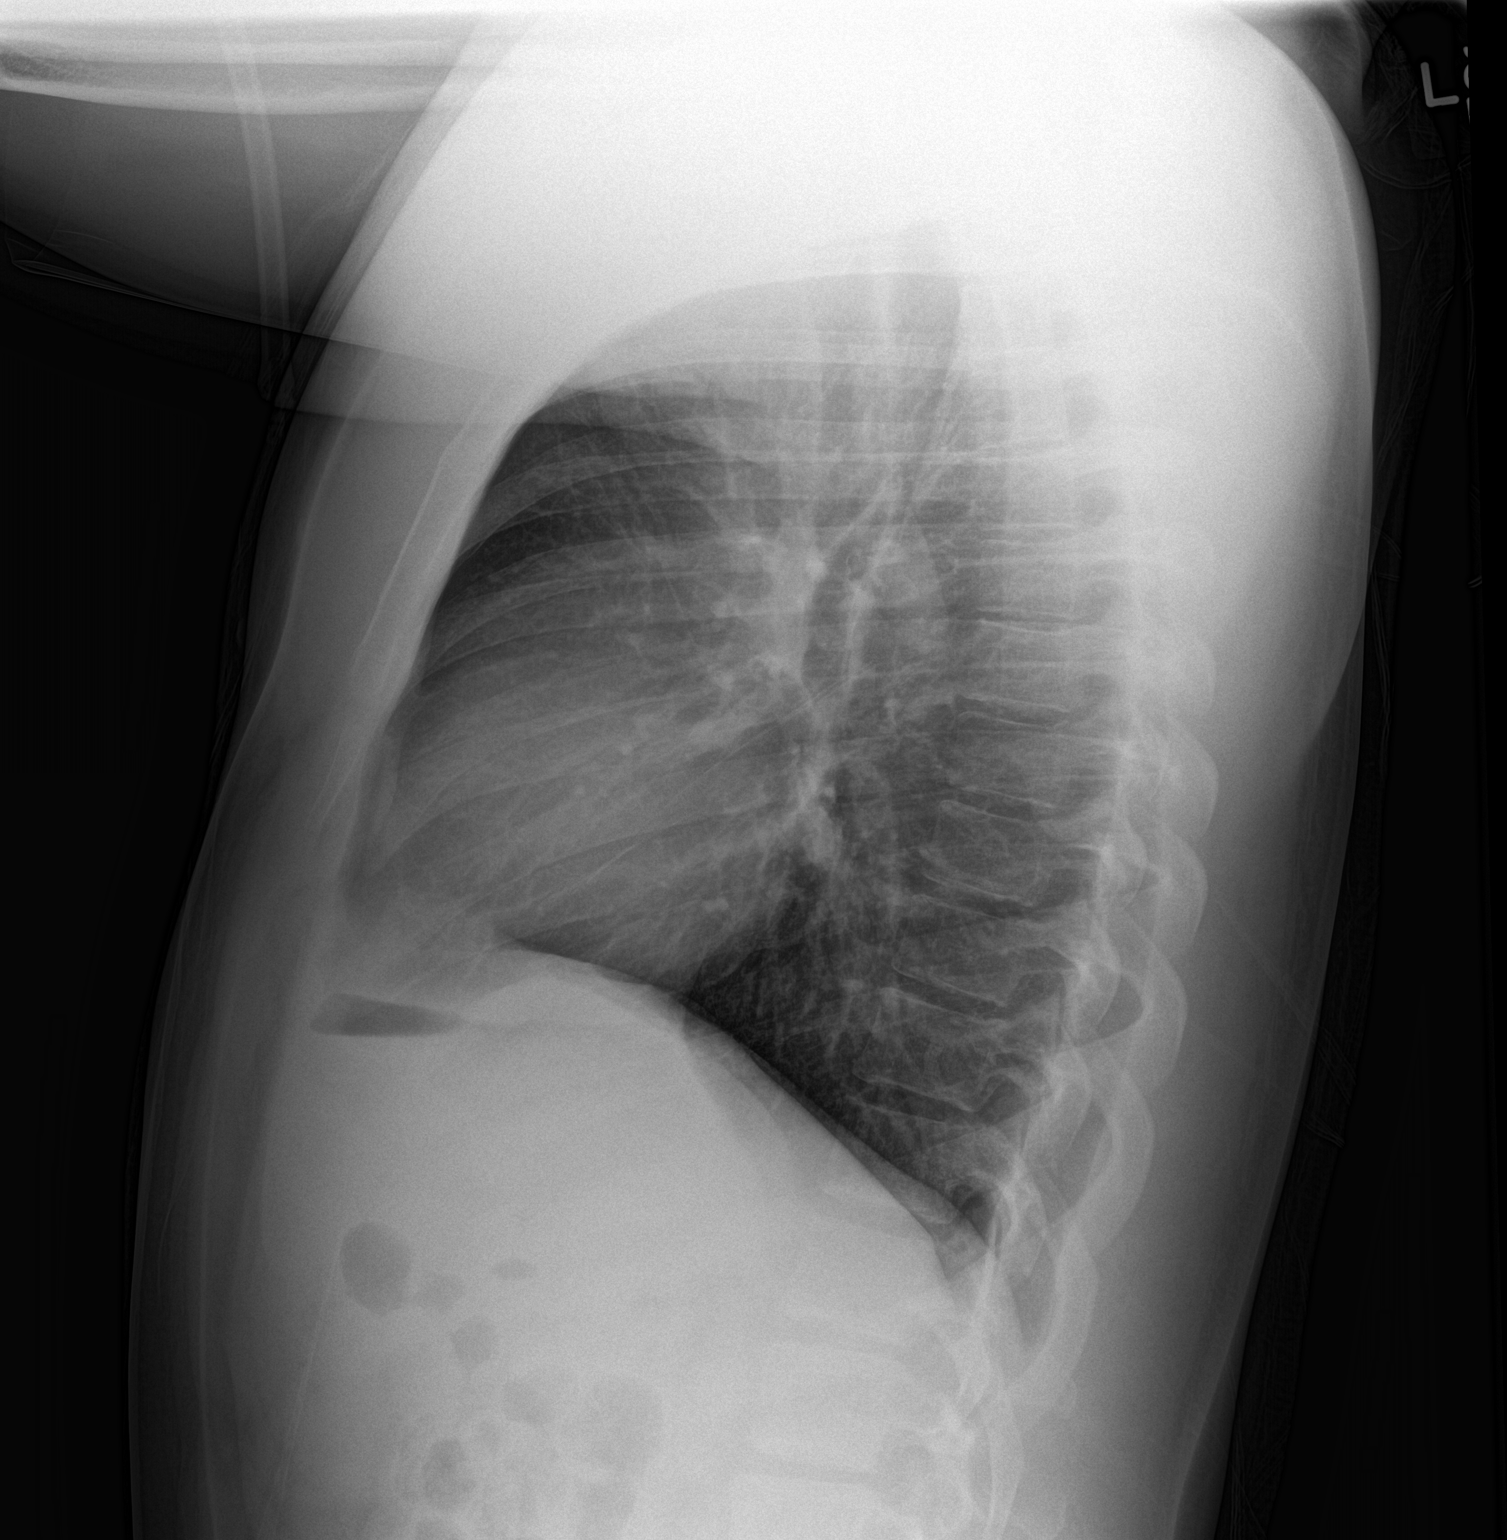

[2 of 2 positions shown; findings below may reference images not displayed]

FINDINGS: The lungs are clear and negative for focal airspace consolidation,
pulmonary edema or suspicious pulmonary nodule. No pleural effusion
or pneumothorax. Cardiac and mediastinal contours are within normal
limits. No acute fracture or lytic or blastic osseous lesions. The
visualized upper abdominal bowel gas pattern is unremarkable.
IMPRESSION: Normal chest x-ray.

## 2016-07-11 ENCOUNTER — Ambulatory Visit (HOSPITAL_COMMUNITY)
Admission: EM | Admit: 2016-07-11 | Discharge: 2016-07-11 | Disposition: A | Discharge status: Home / Self Care | Payer: BLUE CROSS/BLUE SHIELD | Attending: Internal Medicine | Admitting: Internal Medicine

## 2016-07-11 DIAGNOSIS — J069 Acute upper respiratory infection, unspecified: Secondary | ICD-10-CM

## 2016-07-11 DIAGNOSIS — Z760 Encounter for issue of repeat prescription: Secondary | ICD-10-CM

## 2016-07-11 MED ORDER — IBUPROFEN 800 MG PO TABS: 800.0000 mg | ORAL_TABLET | Freq: Three times a day (TID) | ORAL | 0 refills | Status: DC

## 2016-07-11 NOTE — ED Provider Notes (Signed)
CSN: [id number]     Arrival date & time 07/11/16  1004 History   First MD Initiated Contact with Patient 07/11/16 1108     Chief Complaint  Patient presents with  . URI   (Consider location/radiation/quality/duration/timing/severity/associated sxs/prior Treatment) 21 year old male complaining of headache, runny nose, cough for 3-4 days. Denies sore throat or earache. Denies any known fevers. Has been taking TheraFlu with modest results. He is also requesting refill of his ibuprofen that his PCP gave him for occasional headaches.      Past Medical History:  Diagnosis Date  . Anxiety   . Migraine    Past Surgical History:  Procedure Laterality Date  . WISDOM TOOTH EXTRACTION     No family history on file. Social History  Substance Use Topics  . Smoking status: Never Smoker  . Smokeless tobacco: Never Used  . Alcohol use No    Review of Systems  Constitutional: Positive for activity change. Negative for diaphoresis, fatigue and fever.  HENT: Positive for congestion and postnasal drip. Negative for ear pain, facial swelling, rhinorrhea, sore throat and trouble swallowing.   Eyes: Negative for pain, discharge and redness.  Respiratory: Positive for cough. Negative for chest tightness and shortness of breath.   Cardiovascular: Negative.   Gastrointestinal: Negative.   Musculoskeletal: Negative.  Negative for neck pain and neck stiffness.  Neurological: Negative.   All other systems reviewed and are negative.   Allergies  Shrimp [shellfish allergy]  Home Medications   Prior to Admission medications   Medication Sig Start Date End Date Taking? Authorizing Provider  Chlorpheniramine-DM 4-30 MG TABS 1 tab po q 6h prn cough and drainage. May cause drowsiness 12/02/15   Hayden Rasmussenavid Yuette Putnam, NP  ibuprofen (ADVIL,MOTRIN) 800 MG tablet Take 1 tablet (800 mg total) by mouth 3 (three) times daily. 07/11/16   Hayden Rasmussenavid Collier Monica, NP  ipratropium (ATROVENT) 0.06 % nasal spray Place 2 sprays into both  nostrils 4 (four) times daily. 11/16/15   Hayden Rasmussenavid Zellie Jenning, NP  predniSONE (DELTASONE) 20 MG tablet Take 3 tablets (60 mg total) by mouth daily. 02/13/16   Servando Salinaatherine H Rossi, NP  traMADol (ULTRAM) 50 MG tablet Take 1 tablet (50 mg total) by mouth every 6 (six) hours as needed. 02/13/16   Servando Salinaatherine H Rossi, NP   Meds Ordered and Administered this Visit  Medications - No data to display  BP 121/66 (BP Location: Left Arm)   Pulse 62   Temp 98 F (36.7 C) (Oral)   Resp 16   SpO2 100%  No data found.   Physical Exam  Constitutional: He is oriented to person, place, and time. He appears well-developed and well-nourished. No distress.  HENT:  Head: Normocephalic.  Right Ear: External ear normal.  Left Ear: External ear normal.  Mouth/Throat: No oropharyngeal exudate.  Bilateral TMs are obstructed by impacted cerumen. Oropharynx with minor cobblestoning and clear PND.  Eyes: EOM are normal. Pupils are equal, round, and reactive to light.  Neck: Normal range of motion. Neck supple.  Cardiovascular: Normal rate, regular rhythm, normal heart sounds and intact distal pulses.   Pulmonary/Chest: Effort normal and breath sounds normal. No respiratory distress. He has no wheezes. He has no rales.  Musculoskeletal: Normal range of motion. He exhibits no edema.  Neurological: He is alert and oriented to person, place, and time.  Skin: Skin is warm and dry.  Psychiatric: He has a normal mood and affect.  Nursing note and vitals reviewed.   Urgent Care Course  Procedures (including critical care time)  Labs Review Labs Reviewed - No data to display  Imaging Review No results found.   Visual Acuity Review  Right Eye Distance:   Left Eye Distance:   Bilateral Distance:    Right Eye Near:   Left Eye Near:    Bilateral Near:         MDM   1. Acute upper respiratory infection   2. Encounter for medication refill    Sudafed PE 10 mg every 4 to 6 hours as needed for  congestion Allegra or Zyrtec daily as needed for drainage and runny nose. For stronger antihistamine may take Chlor-Trimeton 2 to 4 mg every 4 to 6 hours, may cause drowsiness. Saline nasal spray used frequently. Ibuprofen 600 mg every 6 hours as needed for pain, discomfort or fever. Drink plenty of fluids and stay well-hydrated. Meds ordered this encounter  Medications  . ibuprofen (ADVIL,MOTRIN) 800 MG tablet    Sig: Take 1 tablet (800 mg total) by mouth 3 (three) times daily.    Dispense:  21 tablet    Refill:  0    Order Specific Question:   Supervising Provider    Answer:   Eustace Moore [[id number]]        Hayden Rasmussen, NP 07/11/16 1153

## 2016-07-11 NOTE — Discharge Instructions (Signed)
Sudafed PE 10 mg every 4 to 6 hours as needed for congestion Allegra or Zyrtec daily as needed for drainage and runny nose. For stronger antihistamine may take Chlor-Trimeton 2 to 4 mg every 4 to 6 hours, may cause drowsiness. Saline nasal spray used frequently. Ibuprofen 800 mg every 8 hours as needed for pain, discomfort or fever. Drink plenty of fluids and stay well-hydrated.

## 2016-07-11 NOTE — ED Triage Notes (Signed)
C/o cold sx for 3-4 days States mucous is yellow States he has nasal congestion Feeling hot  thera flu was taking as tx

## 2017-04-30 IMAGING — DX DG ANKLE COMPLETE 3+V*R*
3 series · 3 of 3 positions shown · non-contrast
Comparison: None.

CLINICAL DATA: Ankle and foot pain.

EXAM:
RIGHT ANKLE - COMPLETE 3+ VIEW

[ankle ap]
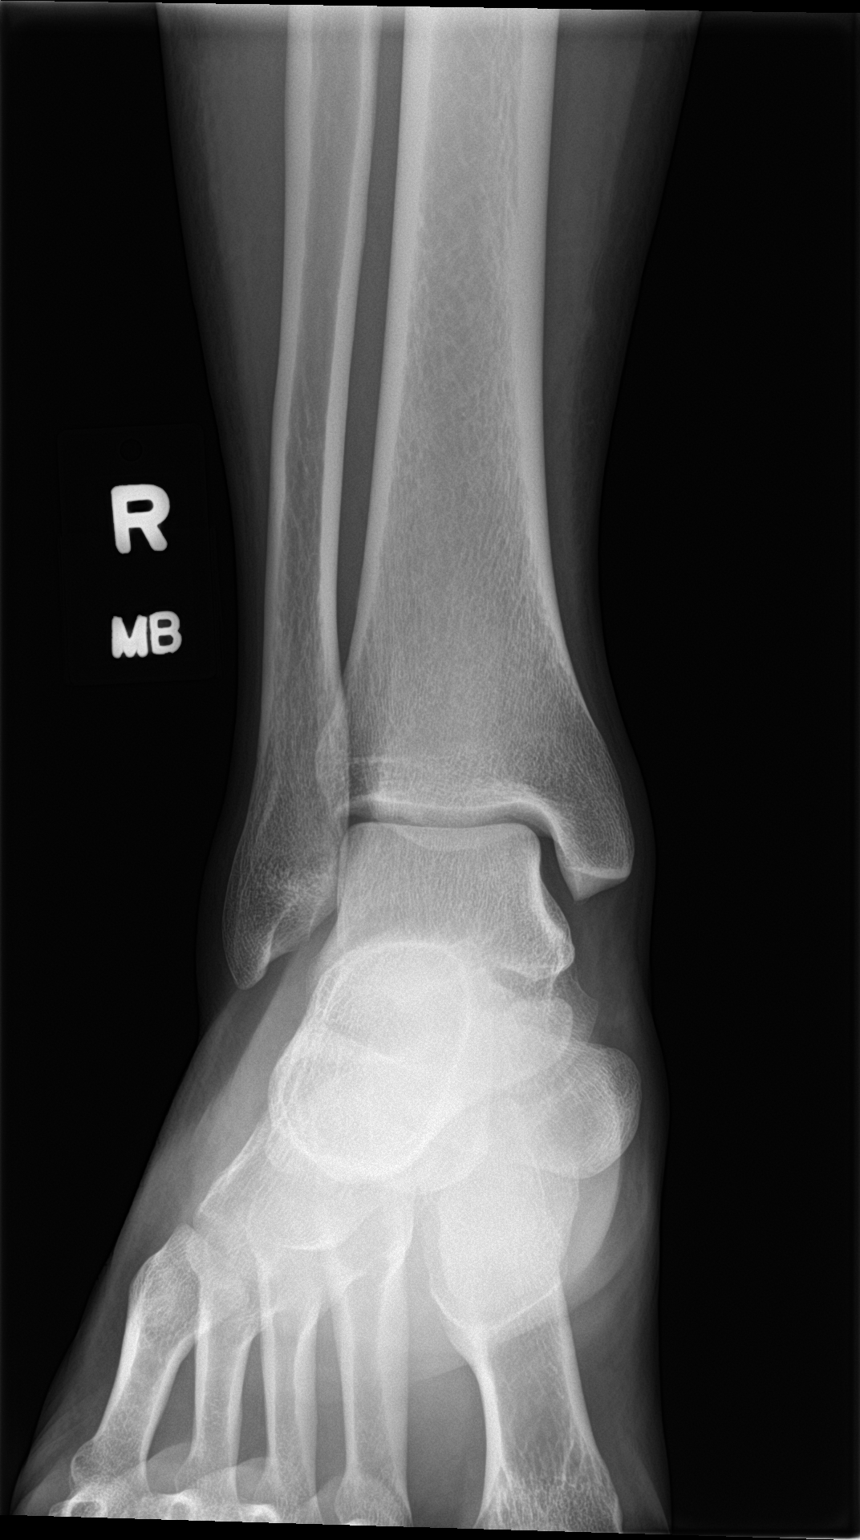

[ankle obl]
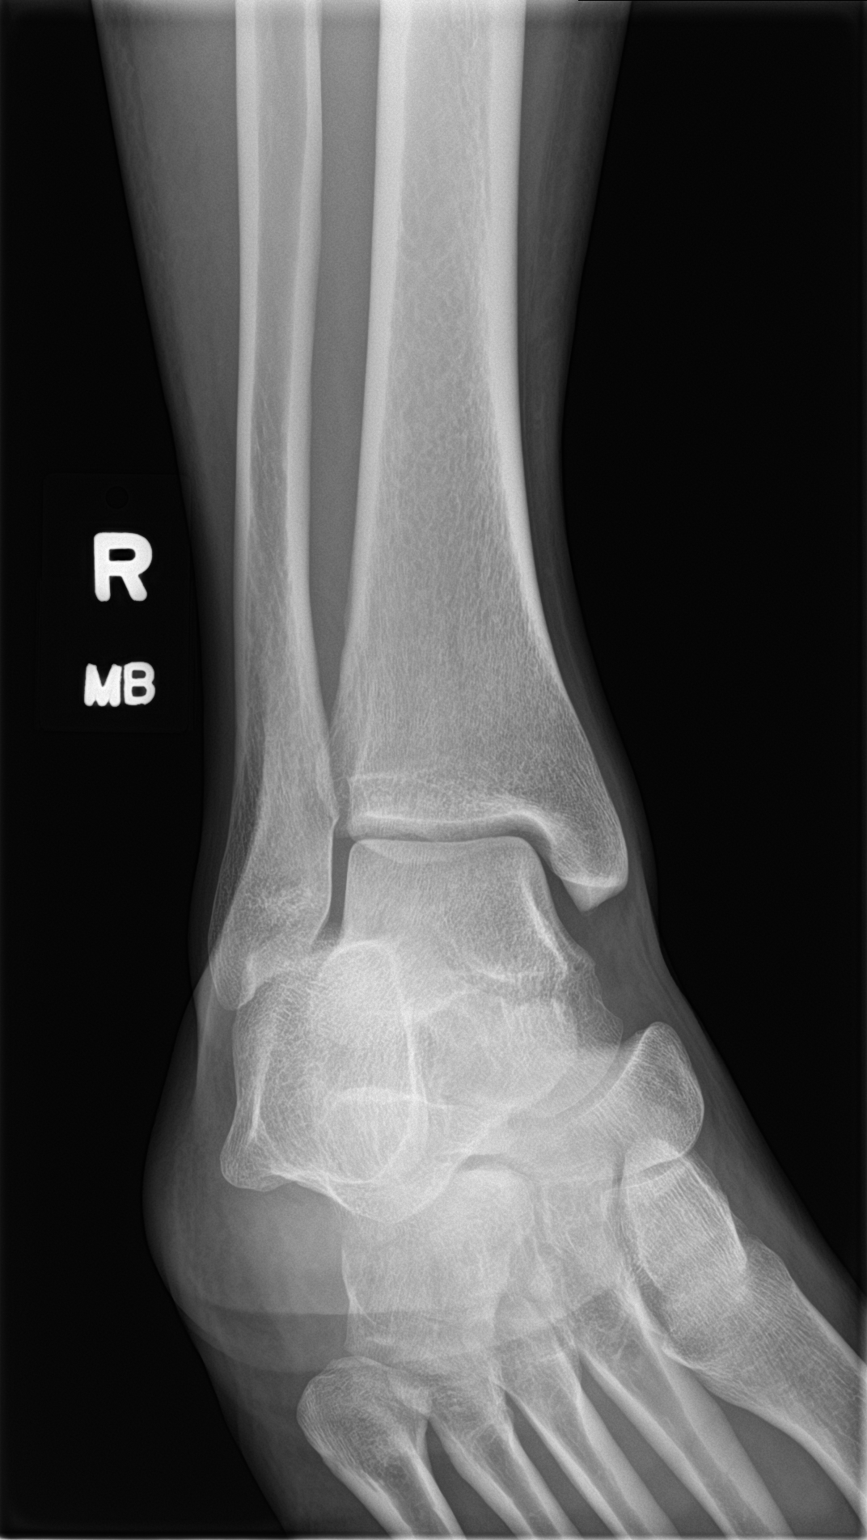

[ankle lat]
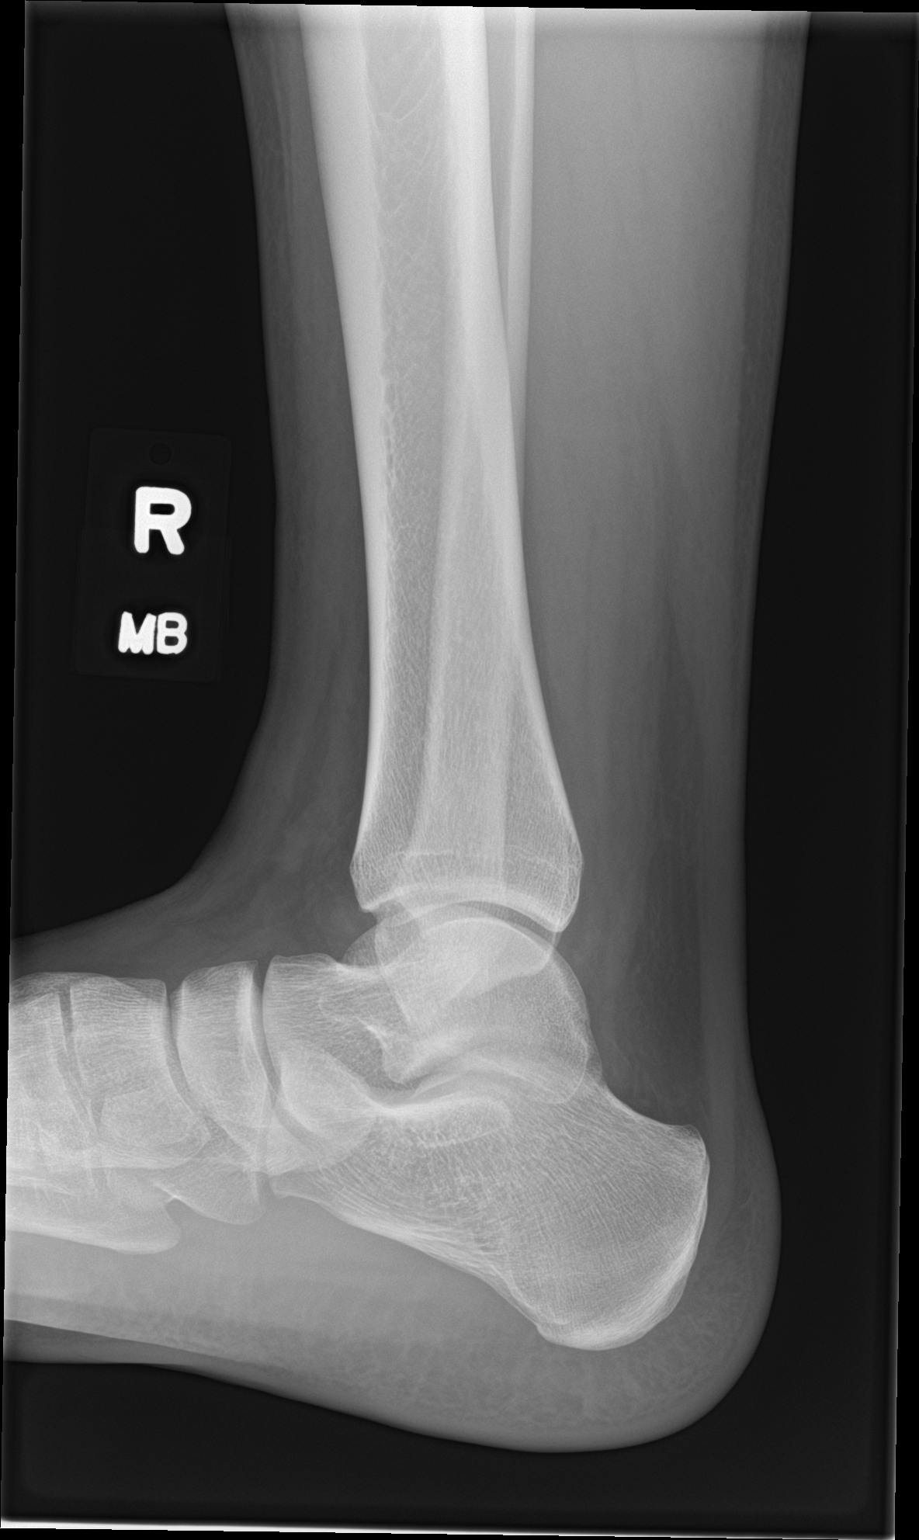

[3 of 3 positions shown; findings below may reference images not displayed]

FINDINGS: There is no evidence of fracture, dislocation, or joint effusion.
There is no evidence of arthropathy or other focal bone abnormality.
Soft tissues are unremarkable.
IMPRESSION: Negative.

## 2017-12-15 ENCOUNTER — Encounter (HOSPITAL_COMMUNITY): Payer: Self-pay

## 2017-12-15 ENCOUNTER — Ambulatory Visit (HOSPITAL_COMMUNITY)
Admission: EM | Admit: 2017-12-15 | Discharge: 2017-12-15 | Disposition: A | Discharge status: Home / Self Care | Payer: BLUE CROSS/BLUE SHIELD | Attending: Family Medicine | Admitting: Family Medicine

## 2017-12-15 DIAGNOSIS — R300 Dysuria: Secondary | ICD-10-CM

## 2017-12-15 DIAGNOSIS — H9202 Otalgia, left ear: Secondary | ICD-10-CM | POA: Diagnosis present

## 2017-12-15 DIAGNOSIS — Z202 Contact with and (suspected) exposure to infections with a predominantly sexual mode of transmission: Secondary | ICD-10-CM | POA: Diagnosis not present

## 2017-12-15 DIAGNOSIS — H6092 Unspecified otitis externa, left ear: Secondary | ICD-10-CM | POA: Insufficient documentation

## 2017-12-15 DIAGNOSIS — H6692 Otitis media, unspecified, left ear: Secondary | ICD-10-CM

## 2017-12-15 DIAGNOSIS — H60392 Other infective otitis externa, left ear: Secondary | ICD-10-CM

## 2017-12-15 DIAGNOSIS — Z113 Encounter for screening for infections with a predominantly sexual mode of transmission: Secondary | ICD-10-CM

## 2017-12-15 LAB — POCT URINALYSIS DIP (DEVICE)
BILIRUBIN URINE: NEGATIVE
Glucose, UA: NEGATIVE mg/dL
HGB URINE DIPSTICK: NEGATIVE
Ketones, ur: NEGATIVE mg/dL
Leukocytes, UA: NEGATIVE
NITRITE: NEGATIVE
PH: 5.5 (ref 5.0–8.0)
PROTEIN: NEGATIVE mg/dL
Specific Gravity, Urine: 1.025 (ref 1.005–1.030)
Urobilinogen, UA: 0.2 mg/dL (ref 0.0–1.0)

## 2017-12-15 MED ORDER — CEFTRIAXONE SODIUM 250 MG IJ SOLR
INTRAMUSCULAR | Status: AC
  Filled 2017-12-15: qty 250

## 2017-12-15 MED ORDER — AZITHROMYCIN 250 MG PO TABS
ORAL_TABLET | ORAL | Status: AC
  Filled 2017-12-15: qty 4

## 2017-12-15 MED ORDER — AZITHROMYCIN 250 MG PO TABS
1000.0000 mg | ORAL_TABLET | Freq: Once | ORAL | Status: AC
  Administered 2017-12-15: 1000 mg via ORAL

## 2017-12-15 MED ORDER — CEFTRIAXONE SODIUM 250 MG IJ SOLR
250.0000 mg | Freq: Once | INTRAMUSCULAR | Status: AC
Start: 2017-12-15 — End: 2017-12-15
  Administered 2017-12-15: 250 mg via INTRAMUSCULAR

## 2017-12-15 MED ORDER — IBUPROFEN 800 MG PO TABS: 800.0000 mg | ORAL_TABLET | Freq: Three times a day (TID) | ORAL | 0 refills | Status: AC

## 2017-12-15 MED ORDER — NEOMYCIN-POLYMYXIN-HC 3.5-10000-1 OT SUSP: 4.0000 [drp] | Freq: Four times a day (QID) | OTIC | 0 refills | Status: AC

## 2017-12-15 NOTE — ED Provider Notes (Signed)
[facility]    CSN: [id number] Arrival date & time: 12/15/17  [id number]     History   Chief Complaint Chief Complaint  Patient presents with  . Dysuria  . Otalgia    HPI Jordan Andrews is a 22 y.o. male.   HPI  Patient is here for 2 problems.  First he says he has ear pain on the left.  No loss of hearing.  No discharge from the ear.  No recent cold or allergy symptoms.  He states this started after he tried to clean out his ear.  He states that hurts to push and pull on his ear.  Denies swimming or water in the ear. He is also here for dysuria.  Is been going on for about a week.  Is getting worse.  No frequency.  No abdominal pain.  No rash.  No penile discharge.  He has had unprotected sexual relations.  He desires testing and treatment for STD exposure.  He declines blood work although it is recommended for him.  We did discuss safe sex and regular condom use.  Past Medical History:  Diagnosis Date  . Anxiety   . Migraine     There are no active problems to display for this patient.   Past Surgical History:  Procedure Laterality Date  . WISDOM TOOTH EXTRACTION         Home Medications    Prior to Admission medications   Medication Sig Start Date End Date Taking? Authorizing Provider  ibuprofen (ADVIL,MOTRIN) 800 MG tablet Take 1 tablet (800 mg total) by mouth 3 (three) times daily. 12/15/17   Eustace MooreNelson, Juwann Sherk Sue, MD  neomycin-polymyxin-hydrocortisone (CORTISPORIN) 3.5-10000-1 OTIC suspension Place 4 drops into the left ear 4 (four) times daily. 12/15/17   Eustace MooreNelson, Arleene Settle Sue, MD    Family History No family history on file. Patient states no known hypertension or heart disease Social History Social History   Tobacco Use  . Smoking status: Never Smoker  . Smokeless tobacco: Never Used  Substance Use Topics  . Alcohol use: No  . Drug use: No     Allergies   Shrimp [shellfish allergy]   Review of Systems Review of Systems  Constitutional: Negative  for chills and fever.  HENT: Positive for ear pain. Negative for ear discharge, hearing loss, rhinorrhea, sinus pain and sore throat.   Eyes: Negative for pain and visual disturbance.  Respiratory: Negative for cough and shortness of breath.   Cardiovascular: Negative for chest pain and palpitations.  Gastrointestinal: Negative for abdominal pain and vomiting.  Genitourinary: Positive for dysuria. Negative for discharge, flank pain, frequency and hematuria.  Musculoskeletal: Negative for arthralgias and back pain.  Skin: Negative for color change and rash.  Neurological: Negative for seizures and syncope.  All other systems reviewed and are negative.    Physical Exam Triage Vital Signs ED Triage Vitals  Enc Vitals Group     BP 12/15/17 0824 119/74     Pulse Rate 12/15/17 0824 74     Resp 12/15/17 0824 16     Temp 12/15/17 0824 98.5 F (36.9 C)     Temp Source 12/15/17 0824 Oral     SpO2 12/15/17 0824 99 %     Weight --      Height --      Head Circumference --      Peak Flow --      Pain Score 12/15/17 0826 3     Pain Loc --  Pain Edu? --      Excl. in GC? --    No data found.  Updated Vital Signs BP 119/74 (BP Location: Left Arm)   Pulse 74   Temp 98.5 F (36.9 C) (Oral)   Resp 16   SpO2 99%   :     Physical Exam  Constitutional: He appears well-developed and well-nourished. No distress.  HENT:  Head: Normocephalic and atraumatic.  Right Ear: External ear normal.  Mouth/Throat: Oropharynx is clear and moist.  Left canal has some erythema but no discharge.  Pain with pressure on tragus.  No pain with pulling the pinna.  TM is clear  Eyes: Pupils are equal, round, and reactive to light. Conjunctivae are normal.  Neck: Normal range of motion. Neck supple.  Cardiovascular: Normal rate, regular rhythm and normal heart sounds.  Pulmonary/Chest: Effort normal and breath sounds normal. No respiratory distress.  Abdominal: Soft. He exhibits no distension.    Musculoskeletal: Normal range of motion. He exhibits no edema.  Lymphadenopathy:    He has no cervical adenopathy.  Neurological: He is alert.  Skin: Skin is warm and dry.  Psychiatric: He has a normal mood and affect. His behavior is normal.     UC Treatments / Results  Labs (all labs ordered are listed, but only abnormal results are displayed) Labs Reviewed  POCT URINALYSIS DIP (DEVICE)  URINE CYTOLOGY ANCILLARY ONLY    EKG None  Radiology No results found.  Procedures Procedures (including critical care time)  Medications Ordered in UC Medications  azithromycin (ZITHROMAX) tablet 1,000 mg (1,000 mg Oral Given 12/15/17 0900)  cefTRIAXone (ROCEPHIN) injection 250 mg (250 mg Intramuscular Given 12/15/17 0900)    Initial Impression / Assessment and Plan / UC Course  I have reviewed the triage vital signs and the nursing notes.  Pertinent labs & imaging results that were available during my care of the patient were reviewed by me and considered in my medical decision making (see chart for details).      Final Clinical Impressions(s) / UC Diagnoses   Final diagnoses:  Possible exposure to STD  Dysuria  Infective otitis externa of left ear     Discharge Instructions     We did lab testing during this visit.  If there are any abnormal findings that require change in medicine or indicate a positive result, you will be notified.  If all of your tests are normal, you will not be called.   Use the eardrops for 3 to 5 days.  May stop when symptoms have improved.   ED Prescriptions    Medication Sig Dispense Auth. Provider   ibuprofen (ADVIL,MOTRIN) 800 MG tablet Take 1 tablet (800 mg total) by mouth 3 (three) times daily. 90 tablet Eustace Moore, MD   neomycin-polymyxin-hydrocortisone (CORTISPORIN) 3.5-10000-1 OTIC suspension Place 4 drops into the left ear 4 (four) times daily. 10 mL Eustace Moore, MD     Controlled Substance Prescriptions [place] Controlled  Substance Registry consulted? Not Applicable   Eustace Moore, MD 12/15/17 1129

## 2017-12-15 NOTE — ED Triage Notes (Signed)
Pt presents with dysuria x 2 weeks and left ear pain x 1 week.

## 2017-12-15 NOTE — Discharge Instructions (Addendum)
We did lab testing during this visit.  If there are any abnormal findings that require change in medicine or indicate a positive result, you will be notified.  If all of your tests are normal, you will not be called.   Use the eardrops for 3 to 5 days.  May stop when symptoms have improved.

## 2017-12-18 LAB — URINE CYTOLOGY ANCILLARY ONLY
Chlamydia: NEGATIVE
NEISSERIA GONORRHEA: NEGATIVE
Trichomonas: NEGATIVE

## 2017-12-22 ENCOUNTER — Ambulatory Visit (HOSPITAL_COMMUNITY)
Admission: EM | Admit: 2017-12-22 | Discharge: 2017-12-22 | Disposition: A | Discharge status: Home / Self Care | Payer: BLUE CROSS/BLUE SHIELD | Attending: Internal Medicine | Admitting: Internal Medicine

## 2017-12-22 ENCOUNTER — Encounter (HOSPITAL_COMMUNITY): Payer: Self-pay

## 2017-12-22 DIAGNOSIS — R21 Rash and other nonspecific skin eruption: Secondary | ICD-10-CM

## 2017-12-22 DIAGNOSIS — L245 Irritant contact dermatitis due to other chemical products: Secondary | ICD-10-CM

## 2017-12-22 MED ORDER — TRIAMCINOLONE ACETONIDE 0.1 % EX CREA: 1.0000 "application " | TOPICAL_CREAM | Freq: Two times a day (BID) | CUTANEOUS | 0 refills | Status: AC

## 2017-12-22 NOTE — ED Triage Notes (Signed)
Pt presents with rash on both arms he believes to be an allergic reaction

## 2017-12-22 NOTE — Discharge Instructions (Signed)
It was nice meeting you!!  I believe that you area having a reaction to the Clorox wipes that you used.  I am giving you some triamcinolone cream to help with the reaction.  I gave you a dermatology referral for removal of your keloid.  Follow up as needed.

## 2017-12-26 NOTE — ED Provider Notes (Signed)
[facility]    CSN: [id number] Arrival date & time: 12/22/17  1017     History   Chief Complaint Chief Complaint  Patient presents with  . Rash    HPI Jordan Andrews is a 22 y.o. male.   Patient is a healthy 22 year old male who presents with a pruritic rash to bilateral arms x2 days.  He reports that this started after using Clorox to clean with.  He has been using Benadryl cream with no relief.  He denies any fever, chills, fatigue, body aches.  He denies any headaches, dizziness, nausea.  He denies any recent changes in foods, lotions, soaps.  He denies any insect bites.   He is also wanting some information about having a keloid on his ear removed.   ROS per HPI      Past Medical History:  Diagnosis Date  . Anxiety   . Migraine     There are no active problems to display for this patient.   Past Surgical History:  Procedure Laterality Date  . WISDOM TOOTH EXTRACTION         Home Medications    Prior to Admission medications   Medication Sig Start Date End Date Taking? Authorizing Provider  ibuprofen (ADVIL,MOTRIN) 800 MG tablet Take 1 tablet (800 mg total) by mouth 3 (three) times daily. 12/15/17   Eustace Moore, MD  neomycin-polymyxin-hydrocortisone (CORTISPORIN) 3.5-10000-1 OTIC suspension Place 4 drops into the left ear 4 (four) times daily. 12/15/17   Eustace Moore, MD  triamcinolone cream (KENALOG) 0.1 % Apply 1 application topically 2 (two) times daily. 12/22/17   Janace Aris, NP    Family History History reviewed. No pertinent family history.  Social History Social History   Tobacco Use  . Smoking status: Never Smoker  . Smokeless tobacco: Never Used  Substance Use Topics  . Alcohol use: No  . Drug use: No     Allergies   Shrimp [shellfish allergy]   Review of Systems Review of Systems   Physical Exam Triage Vital Signs ED Triage Vitals  Enc Vitals Group     BP 12/22/17 1044 118/70     Pulse Rate 12/22/17  1044 66     Resp 12/22/17 1044 20     Temp 12/22/17 1044 98.1 F (36.7 C)     Temp Source 12/22/17 1044 Oral     SpO2 12/22/17 1044 100 %     Weight --      Height --      Head Circumference --      Peak Flow --      Pain Score 12/22/17 1043 0     Pain Loc --      Pain Edu? --      Excl. in GC? --    No data found.  Updated Vital Signs BP 118/70 (BP Location: Left Arm)   Pulse 66   Temp 98.1 F (36.7 C) (Oral)   Resp 20   SpO2 100%   Visual Acuity Right Eye Distance:   Left Eye Distance:   Bilateral Distance:    Right Eye Near:   Left Eye Near:    Bilateral Near:     Physical Exam  Constitutional: He is oriented to person, place, and time. He appears well-developed and well-nourished.  HENT:  Head: Normocephalic and atraumatic.  Eyes: Pupils are equal, round, and reactive to light.  Neck: Normal range of motion. Neck supple.  Musculoskeletal: Normal range of motion.  Neurological:  He is alert and oriented to person, place, and time.  Skin: Skin is warm and dry. Capillary refill takes less than 2 seconds. Rash noted.  Erythematous papular rash to bilateral anterior forearms  Psychiatric: He has a normal mood and affect.  Nursing note and vitals reviewed.    UC Treatments / Results  Labs (all labs ordered are listed, but only abnormal results are displayed) Labs Reviewed - No data to display  EKG None  Radiology No results found.  Procedures Procedures (including critical care time)  Medications Ordered in UC Medications - No data to display  Initial Impression / Assessment and Plan / UC Course  I have reviewed the triage vital signs and the nursing notes.  Pertinent labs & imaging results that were available during my care of the patient were reviewed by me and considered in my medical decision making (see chart for details).     Most likely contact dermatitis from the Clorox.  Will treat with triamcinolone cream.  Gave referral for keloid  removal.  Follow-up as needed. Final Clinical Impressions(s) / UC Diagnoses   Final diagnoses:  Irritant contact dermatitis due to other chemical products     Discharge Instructions     It was nice meeting you!!  I believe that you area having a reaction to the Clorox wipes that you used.  I am giving you some triamcinolone cream to help with the reaction.  I gave you a dermatology referral for removal of your keloid.  Follow up as needed.    ED Prescriptions    Medication Sig Dispense Auth. Provider   triamcinolone cream (KENALOG) 0.1 % Apply 1 application topically 2 (two) times daily. 30 g [name], Raydel Hosick A, NP     Controlled Substance Prescriptions [place] Controlled Substance Registry consulted? Not Applicable   Janace ArisBast, Alahia Whicker A, NP 12/26/17 1257

## 2018-01-11 ENCOUNTER — Other Ambulatory Visit: Payer: Self-pay | Admitting: Family Medicine

## 2018-01-18 ENCOUNTER — Other Ambulatory Visit: Payer: Self-pay | Admitting: Family Medicine
